# Patient Record
Sex: Female | Born: 1980 | Hispanic: No | Smoking: Former smoker
Health system: Southern US, Community
[De-identification: ages and names within clinical notes are randomized; demographics above are authoritative.]

## PROBLEM LIST (undated history)

## (undated) DIAGNOSIS — F909 Attention-deficit hyperactivity disorder, unspecified type: Secondary | ICD-10-CM

## (undated) DIAGNOSIS — O26619 Liver and biliary tract disorders in pregnancy, unspecified trimester: Secondary | ICD-10-CM

## (undated) DIAGNOSIS — K831 Obstruction of bile duct: Secondary | ICD-10-CM

## (undated) DIAGNOSIS — O26649 Intrahepatic cholestasis of pregnancy, unspecified trimester: Secondary | ICD-10-CM

## (undated) HISTORY — PX: WISDOM TOOTH EXTRACTION: SHX21

## (undated) HISTORY — DX: Obstruction of bile duct: O26.619

## (undated) HISTORY — DX: Obstruction of bile duct: K83.1

## (undated) HISTORY — DX: Intrahepatic cholestasis of pregnancy, unspecified trimester: O26.649

---

## 2018-04-04 ENCOUNTER — Other Ambulatory Visit: Payer: Self-pay | Admitting: Obstetrics and Gynecology

## 2018-04-04 ENCOUNTER — Other Ambulatory Visit (HOSPITAL_COMMUNITY)
Admission: RE | Admit: 2018-04-04 | Discharge: 2018-04-04 | Disposition: A | Discharge status: Home / Self Care | Payer: 59 | Source: Ambulatory Visit | Attending: Obstetrics and Gynecology | Admitting: Obstetrics and Gynecology

## 2018-04-04 DIAGNOSIS — Z01411 Encounter for gynecological examination (general) (routine) with abnormal findings: Secondary | ICD-10-CM | POA: Diagnosis present

## 2018-04-09 LAB — CYTOLOGY - PAP
Diagnosis: NEGATIVE
HPV: NOT DETECTED

## 2021-04-19 ENCOUNTER — Encounter (HOSPITAL_COMMUNITY): Payer: Self-pay | Admitting: Obstetrics and Gynecology

## 2021-04-19 ENCOUNTER — Inpatient Hospital Stay (HOSPITAL_COMMUNITY)
Admission: AD | Admit: 2021-04-19 | Discharge: 2021-04-19 | Disposition: A | Discharge status: Home / Self Care | Payer: BLUE CROSS/BLUE SHIELD | Attending: Obstetrics and Gynecology | Admitting: Obstetrics and Gynecology

## 2021-04-19 ENCOUNTER — Inpatient Hospital Stay (HOSPITAL_COMMUNITY): Payer: BLUE CROSS/BLUE SHIELD

## 2021-04-19 ENCOUNTER — Other Ambulatory Visit: Payer: Self-pay

## 2021-04-19 DIAGNOSIS — Z87891 Personal history of nicotine dependence: Secondary | ICD-10-CM | POA: Insufficient documentation

## 2021-04-19 DIAGNOSIS — Z3A01 Less than 8 weeks gestation of pregnancy: Secondary | ICD-10-CM | POA: Insufficient documentation

## 2021-04-19 DIAGNOSIS — O4691 Antepartum hemorrhage, unspecified, first trimester: Secondary | ICD-10-CM | POA: Diagnosis not present

## 2021-04-19 DIAGNOSIS — O3680X0 Pregnancy with inconclusive fetal viability, not applicable or unspecified: Secondary | ICD-10-CM | POA: Diagnosis present

## 2021-04-19 DIAGNOSIS — W19XXXA Unspecified fall, initial encounter: Secondary | ICD-10-CM | POA: Insufficient documentation

## 2021-04-19 DIAGNOSIS — O3411 Maternal care for benign tumor of corpus uteri, first trimester: Secondary | ICD-10-CM | POA: Diagnosis not present

## 2021-04-19 DIAGNOSIS — O99891 Other specified diseases and conditions complicating pregnancy: Secondary | ICD-10-CM

## 2021-04-19 DIAGNOSIS — O209 Hemorrhage in early pregnancy, unspecified: Secondary | ICD-10-CM | POA: Diagnosis not present

## 2021-04-19 DIAGNOSIS — O09521 Supervision of elderly multigravida, first trimester: Secondary | ICD-10-CM | POA: Insufficient documentation

## 2021-04-19 DIAGNOSIS — W109XXA Fall (on) (from) unspecified stairs and steps, initial encounter: Secondary | ICD-10-CM

## 2021-04-19 DIAGNOSIS — D252 Subserosal leiomyoma of uterus: Secondary | ICD-10-CM | POA: Insufficient documentation

## 2021-04-19 HISTORY — DX: Attention-deficit hyperactivity disorder, unspecified type: F90.9

## 2021-04-19 LAB — CBC
HCT: 36.3 % (ref 36.0–46.0)
Hemoglobin: 12.3 g/dL (ref 12.0–15.0)
MCH: 30.6 pg (ref 26.0–34.0)
MCHC: 33.9 g/dL (ref 30.0–36.0)
MCV: 90.3 fL (ref 80.0–100.0)
Platelets: 248 10*3/uL (ref 150–400)
RBC: 4.02 MIL/uL (ref 3.87–5.11)
RDW: 12.1 % (ref 11.5–15.5)
WBC: 9.1 10*3/uL (ref 4.0–10.5)
nRBC: 0 % (ref 0.0–0.2)

## 2021-04-19 LAB — ABO/RH: ABO/RH(D): A POS

## 2021-04-19 LAB — POCT PREGNANCY, URINE: Preg Test, Ur: POSITIVE — AB

## 2021-04-19 LAB — HCG, QUANTITATIVE, PREGNANCY: hCG, Beta Chain, Quant, S: 2809 m[IU]/mL — ABNORMAL HIGH (ref <5)

## 2021-04-19 LAB — URINALYSIS, ROUTINE W REFLEX MICROSCOPIC
Bilirubin Urine: NEGATIVE
Glucose, UA: NEGATIVE mg/dL
Hgb urine dipstick: NEGATIVE
Ketones, ur: NEGATIVE mg/dL
Leukocytes,Ua: NEGATIVE
Nitrite: NEGATIVE
Protein, ur: NEGATIVE mg/dL
Specific Gravity, Urine: 1.016 (ref 1.005–1.030)
pH: 5 (ref 5.0–8.0)

## 2021-04-19 NOTE — MAU Provider Note (Signed)
History     CSN: [id number]  Arrival date and time: 04/19/21 1911   Event Date/Time   First Provider Initiated Contact with Patient 04/19/21 2025      Chief Complaint  Patient presents with   Vaginal Bleeding   Fall   HPI Alicia Burch is a 40 y.o. [id number] at unknown gestation who presents with vaginal bleeding. She states she fell around 1400 on her stairs. She landed on her bottom and left arm. She states after the fall, she went to the bathroom and saw bright red bleeding when she wiped. She took a shower and saw it again after so she came in. She denies any abdominal pain. She reports pain in her tailbone and feeling like her left arm might bruise.   She was seen at [facility] on Monday and had an early ultrasound and blood work. She reports they only saw a gestational sac on the ultrasound and did not do an HCG. She reports she had an HCG done in mid June at an urgent care that was around 800.  OB History     Gravida  5   Para  3   Term  3   Preterm      AB  1   Living  3      SAB      IAB  1   Ectopic      Multiple      Live Births  3           Past Medical History:  Diagnosis Date   ADHD     Past Surgical History:  Procedure Laterality Date   WISDOM TOOTH EXTRACTION      No family history on file.  Social History   Tobacco Use   Smoking status: Former    Pack years: 0.00    Types: Cigarettes  Vaping Use   Vaping Use: Some days  Substance Use Topics   Alcohol use: Not Currently   Drug use: Never    Allergies:  Allergies  Allergen Reactions   Avelox [Moxifloxacin Hcl] Hives   Strattera [Atomoxetine] Other (See Comments)    Migraines    Medications Prior to Admission  Medication Sig Dispense Refill Last Dose   omega-3 acid ethyl esters (LOVAZA) 1 g capsule Take by mouth 2 (two) times daily.      Prenatal Vit-Fe Fumarate-FA (MULTIVITAMIN-PRENATAL) 27-0.8 MG TABS tablet Take 1 tablet by mouth daily at 12 noon.      Probiotic  Product (PROBIOTIC DAILY PO) Take by mouth.       Review of Systems  Constitutional: Negative.  Negative for fatigue and fever.  HENT: Negative.    Respiratory: Negative.  Negative for shortness of breath.   Cardiovascular: Negative.  Negative for chest pain.  Gastrointestinal: Negative.  Negative for abdominal pain, constipation, diarrhea, nausea and vomiting.  Genitourinary:  Positive for vaginal bleeding. Negative for dysuria and vaginal discharge.  Musculoskeletal:        Arm pain, tailbone pain  Neurological: Negative.  Negative for dizziness and headaches.  Physical Exam   Blood pressure 120/70, pulse 82, temperature 98.2 F (36.8 C), resp. rate 18, height 4' 8.5" (1.435 m), weight 94.3 kg, last menstrual period 03/30/2021.  Physical Exam Vitals and nursing note reviewed.  Constitutional:      General: She is not in acute distress.    Appearance: She is well-developed.  HENT:     Head: Normocephalic.  Eyes:     Pupils:  Pupils are equal, round, and reactive to light.  Cardiovascular:     Rate and Rhythm: Normal rate and regular rhythm.     Heart sounds: Normal heart sounds.  Pulmonary:     Effort: Pulmonary effort is normal. No respiratory distress.     Breath sounds: Normal breath sounds.  Abdominal:     General: Bowel sounds are normal. There is no distension.     Palpations: Abdomen is soft.     Tenderness: There is no abdominal tenderness.  Skin:    General: Skin is warm and dry.  Neurological:     Mental Status: She is alert and oriented to person, place, and time.  Psychiatric:        Mood and Affect: Mood normal.        Behavior: Behavior normal.        Thought Content: Thought content normal.        Judgment: Judgment normal.    MAU Course  Procedures Results for orders placed or performed during the hospital encounter of 04/19/21 (from the past 24 hour(s))  Pregnancy, urine POC     Status: Abnormal   Collection Time: 04/19/21  7:51 PM  Result Value  Ref Range   Preg Test, Ur POSITIVE (A) NEGATIVE  CBC     Status: None   Collection Time: 04/19/21  8:37 PM  Result Value Ref Range   WBC 9.1 4.0 - 10.5 K/uL   RBC 4.02 3.87 - 5.11 MIL/uL   Hemoglobin 12.3 12.0 - 15.0 g/dL   HCT 36.3 36.0 - 46.0 %   MCV 90.3 80.0 - 100.0 fL   MCH 30.6 26.0 - 34.0 pg   MCHC 33.9 30.0 - 36.0 g/dL   RDW 12.1 11.5 - 15.5 %   Platelets 248 150 - 400 K/uL   nRBC 0.0 0.0 - 0.2 %  hCG, quantitative, pregnancy     Status: Abnormal   Collection Time: 04/19/21  8:37 PM  Result Value Ref Range   hCG, Beta Chain, Quant, S 2,809 (H) <5 mIU/mL  ABO/Rh     Status: None   Collection Time: 04/19/21  8:37 PM  Result Value Ref Range   ABO/RH(D) A POS    No rh immune globuloin      NOT A RH IMMUNE GLOBULIN CANDIDATE, PT RH POSITIVE Performed at [facility], [place] [address] [place], [place] [address]   Urinalysis, Routine w reflex microscopic Urine, Clean Catch     Status: None   Collection Time: 04/19/21  9:11 PM  Result Value Ref Range   Color, Urine YELLOW YELLOW   APPearance CLEAR CLEAR   Specific Gravity, Urine 1.016 1.005 - 1.030   pH 5.0 5.0 - 8.0   Glucose, UA NEGATIVE NEGATIVE mg/dL   Hgb urine dipstick NEGATIVE NEGATIVE   Bilirubin Urine NEGATIVE NEGATIVE   Ketones, ur NEGATIVE NEGATIVE mg/dL   Protein, ur NEGATIVE NEGATIVE mg/dL   Nitrite NEGATIVE NEGATIVE   Leukocytes,Ua NEGATIVE NEGATIVE    US OB LESS THAN 14 WEEKS WITH OB TRANSVAGINAL  Result Date: 04/19/2021 CLINICAL DATA:  Vaginal bleeding in 1st trimester pregnancy. EXAM: OBSTETRIC <14 WK [place] AND TRANSVAGINAL OB US TECHNIQUE: Both transabdominal and transvaginal ultrasound examinations were performed for complete evaluation of the gestation as well as the maternal uterus, adnexal regions, and pelvic cul-de-sac. Transvaginal technique was performed to assess early pregnancy. COMPARISON:  None. FINDINGS: Intrauterine gestational sac: Single Yolk sac:  Not Visualized. Embryo:  Not Visualized.  MSD:  10 mm   5 w   5 d Subchorionic hemorrhage:  None visualized. Maternal uterus/adnexae: A small subserosal fibroid is seen in the left fundal region which measures 2.7 cm in maximum diameter. Both ovaries are normal in appearance. No adnexal mass or abnormal free fluid identified. IMPRESSION: Single intrauterine gestational sac measuring 5 weeks 5 days by mean sac diameter. Consider correlation with serial b-hCG levels, and followup ultrasound to assess viability in 10-14 days. 2.7 cm uterine fibroid. Electronically Signed   By: Marlaine Hind M.D.   On: 04/19/2021 21:42     MDM No records available for review from office or urgent care.   UA, UPT CBC, HCG ABO/Rh- A Pos Wet prep and gc/chlamydia- patient declined pelvic exam stating she will have that done in the office US OB Comp Less 14 weeks with Transvaginal  Consulted with Dr. Elly Modena given HCG >1500 but no IUP- recommends repeat HCG in office in 48 hours.   Results reviewed with patient and plan of care. Encouraged patient to call office on Tuesday to make sure appointment is scheduled and ectopic precautions reviewed at length.   Clois Dupes CNM notified of patient complaint and results so that office can be notified.   Assessment and Plan   1. Pregnancy of unknown anatomic location   2. Vaginal bleeding affecting early pregnancy   3. [redacted] weeks gestation of pregnancy    -Discharge home in stable condition -Strict ectopic precautions discussed -Patient advised to follow-up with OB Tuesday afternoon or Wednesday morning for repeat HCG -Patient may return to MAU as needed or if her condition were to change or worsen   Wende Mott CNM 04/19/2021, 8:25 PM

## 2021-04-19 NOTE — MAU Note (Signed)
Pt is [redacted] weeks pregnant. Stated she was going down her stairs and her foot slipped. Landed hard on her bottom and bounced down a few more stairs.  She noticed  vaginal bleeding  a little while after.  Denies any abd pain or cramping but c/o pain in her tailbone area where she landed and bruising on her left forearm and soreness in her right shoulder area.

## 2021-12-08 LAB — OB RESULTS CONSOLE RPR: RPR: NONREACTIVE

## 2021-12-08 LAB — OB RESULTS CONSOLE HEPATITIS B SURFACE ANTIGEN: Hepatitis B Surface Ag: NEGATIVE

## 2021-12-08 LAB — OB RESULTS CONSOLE HIV ANTIBODY (ROUTINE TESTING): HIV: NONREACTIVE

## 2021-12-08 LAB — OB RESULTS CONSOLE RUBELLA ANTIBODY, IGM: Rubella: IMMUNE

## 2021-12-08 LAB — HEPATITIS C ANTIBODY: HCV Ab: NEGATIVE

## 2021-12-16 LAB — OB RESULTS CONSOLE GC/CHLAMYDIA
Chlamydia: NEGATIVE
Neisseria Gonorrhea: NEGATIVE

## 2022-02-25 ENCOUNTER — Inpatient Hospital Stay (HOSPITAL_COMMUNITY)
Admission: AD | Admit: 2022-02-25 | Discharge: 2022-02-26 | Disposition: A | Discharge status: Home / Self Care | Payer: Medicaid Other | Attending: Obstetrics and Gynecology | Admitting: Obstetrics and Gynecology

## 2022-02-25 ENCOUNTER — Other Ambulatory Visit: Payer: Self-pay

## 2022-02-25 ENCOUNTER — Encounter (HOSPITAL_COMMUNITY): Payer: Self-pay | Admitting: Obstetrics and Gynecology

## 2022-02-25 DIAGNOSIS — Z3A21 21 weeks gestation of pregnancy: Secondary | ICD-10-CM | POA: Insufficient documentation

## 2022-02-25 DIAGNOSIS — I10 Essential (primary) hypertension: Secondary | ICD-10-CM

## 2022-02-25 DIAGNOSIS — O26892 Other specified pregnancy related conditions, second trimester: Secondary | ICD-10-CM | POA: Insufficient documentation

## 2022-02-25 DIAGNOSIS — M7989 Other specified soft tissue disorders: Secondary | ICD-10-CM | POA: Insufficient documentation

## 2022-02-26 ENCOUNTER — Encounter (HOSPITAL_COMMUNITY): Payer: Self-pay | Admitting: Obstetrics and Gynecology

## 2022-02-26 DIAGNOSIS — M7989 Other specified soft tissue disorders: Secondary | ICD-10-CM | POA: Diagnosis not present

## 2022-02-26 DIAGNOSIS — Z3A21 21 weeks gestation of pregnancy: Secondary | ICD-10-CM

## 2022-02-26 DIAGNOSIS — I10 Essential (primary) hypertension: Secondary | ICD-10-CM

## 2022-02-26 DIAGNOSIS — O26892 Other specified pregnancy related conditions, second trimester: Secondary | ICD-10-CM | POA: Diagnosis not present

## 2022-02-26 LAB — CBC
HCT: 31.1 % — ABNORMAL LOW (ref 36.0–46.0)
Hemoglobin: 10.9 g/dL — ABNORMAL LOW (ref 12.0–15.0)
MCH: 30.7 pg (ref 26.0–34.0)
MCHC: 35 g/dL (ref 30.0–36.0)
MCV: 87.6 fL (ref 80.0–100.0)
Platelets: 230 10*3/uL (ref 150–400)
RBC: 3.55 MIL/uL — ABNORMAL LOW (ref 3.87–5.11)
RDW: 12.2 % (ref 11.5–15.5)
WBC: 10.1 10*3/uL (ref 4.0–10.5)
nRBC: 0 % (ref 0.0–0.2)

## 2022-02-26 LAB — URINALYSIS, ROUTINE W REFLEX MICROSCOPIC
Bilirubin Urine: NEGATIVE
Glucose, UA: NEGATIVE mg/dL
Hgb urine dipstick: NEGATIVE
Ketones, ur: NEGATIVE mg/dL
Leukocytes,Ua: NEGATIVE
Nitrite: NEGATIVE
Protein, ur: NEGATIVE mg/dL
Specific Gravity, Urine: 1.005 (ref 1.005–1.030)
pH: 5 (ref 5.0–8.0)

## 2022-02-26 LAB — COMPREHENSIVE METABOLIC PANEL
ALT: 25 U/L (ref 0–44)
AST: 25 U/L (ref 15–41)
Albumin: 3 g/dL — ABNORMAL LOW (ref 3.5–5.0)
Alkaline Phosphatase: 56 U/L (ref 38–126)
Anion gap: 6 (ref 5–15)
BUN: 7 mg/dL (ref 6–20)
CO2: 22 mmol/L (ref 22–32)
Calcium: 9 mg/dL (ref 8.9–10.3)
Chloride: 105 mmol/L (ref 98–111)
Creatinine, Ser: 0.59 mg/dL (ref 0.44–1.00)
GFR, Estimated: >60 mL/min (ref >60)
Glucose, Bld: 96 mg/dL (ref 70–99)
Potassium: 3.8 mmol/L (ref 3.5–5.1)
Sodium: 133 mmol/L — ABNORMAL LOW (ref 135–145)
Total Bilirubin: 0.6 mg/dL (ref 0.3–1.2)
Total Protein: 6.2 g/dL — ABNORMAL LOW (ref 6.5–8.1)

## 2022-02-26 LAB — PROTEIN / CREATININE RATIO, URINE
Creatinine, Urine: 56.63 mg/dL
Total Protein, Urine: <6 mg/dL

## 2022-02-26 NOTE — MAU Note (Signed)
Pt says started Monday- back hurt and pain under her ribs- feet and ankles swollen - applied ice ?Then Tuesday- rested with feet propped - had H/A -took Tyl- helped  ?Wed- slight H/A ?Today- feet hurt and swelled, BP- at [facility]- 140/84 at 9pm- called Dr office - told to come here . Feels less movement   ?Takes  ASA everyday  ? [facility] - Dr Delora Fuel  ?

## 2022-02-26 NOTE — MAU Provider Note (Signed)
Chief Complaint:  Foot Swelling ? ? Event Date/Time  ? First Provider Initiated Contact with Patient 02/26/22 0214   ? ? HPI: Alicia Burch is a 41 y.o. [id number] at 1w2dho presents to maternity admissions reporting intermittent back and upper abdominal pain (none now), headache (resolved) and feet swelling this week.  [facility] and took her BP which was elevated. Has not confirmed BP anywhere else. No history or hypertension..Marland Kitchen?She reports good fetal movement, denies LOF, vaginal bleeding, vaginal itching/burning, urinary symptoms, h/a, dizziness, n/v, diarrhea, constipation or fever/chills. ? ?Hypertension ?This is a new problem. The current episode started today. The problem has been resolved since onset. Associated symptoms include headaches (one slight headache on Wednesday). Pertinent negatives include no anxiety, blurred vision, chest pain, malaise/fatigue or palpitations. There are no associated agents to hypertension. There are no known risk factors for coronary artery disease. Past treatments include nothing.  ? ?RN Note ?Pt says started Monday- back hurt and pain under her ribs- feet and ankles swollen - applied ice ?Then Tuesday- rested with feet propped - had H/A -took Tyl- helped  ?Wed- slight H/A ?Today- feet hurt and swelled, [facility]- 140/84 at 9pm- called Dr office - told to come here . Feels less movement   ?Takes  ASA everyday  [facility] ? ?Past Medical History: ?Past Medical History:  ?Diagnosis Date  ? ADHD   ? ? ?Past obstetric history: ?OB History  ?Gravida Para Term Preterm AB Living  ?'6 3 3   2 3  '$ ?SAB IAB Ectopic Multiple Live Births  ?'1 1     3  '$ ?  ?# Outcome Date GA Lbr Len/2nd Weight Sex Delivery Anes PTL Lv  ?6 Current           ?5 SAB           ?4 IAB           ?3 Term      Vag-Spont   LIV  ?2 Term      Vag-Spont   LIV  ?1 Term      Vag-Spont   LIV  ? ? ?Past Surgical History: ?Past Surgical History:  ?Procedure Laterality Date  ? WISDOM TOOTH EXTRACTION     ? ? ?Family History: ?History reviewed. No pertinent family history. ? ?Social History: ?Social History  ? ?Tobacco Use  ? Smoking status: Former  ?  Types: Cigarettes  ?Vaping Use  ? Vaping Use: Some days  ?Substance Use Topics  ? Alcohol use: Not Currently  ? Drug use: Never  ? ? ?Allergies:  ?Allergies  ?Allergen Reactions  ? Avelox [Moxifloxacin Hcl] Hives  ? Strattera [Atomoxetine] Other (See Comments)  ?  Migraines  ? ? ?Meds:  ?Medications Prior to Admission  ?Medication Sig Dispense Refill Last Dose  ? omega-3 acid ethyl esters (LOVAZA) 1 g capsule Take by mouth 2 (two) times daily.     ? Prenatal Vit-Fe Fumarate-FA (MULTIVITAMIN-PRENATAL) 27-0.8 MG TABS tablet Take 1 tablet by mouth daily at 12 noon.     ? Probiotic Product (PROBIOTIC DAILY PO) Take by mouth.     ? ? ?I have reviewed patient's Past Medical Hx, Surgical Hx, Family Hx, Social Hx, medications and allergies.  ? ?ROS:  ?Review of Systems  ?Constitutional:  Negative for malaise/fatigue.  ?Eyes:  Negative for blurred vision.  ?Cardiovascular:  Negative for chest pain and palpitations.  ?Neurological:  Positive for headaches (one slight headache on Wednesday).  ?  Other systems negative ? ?Physical Exam  ?Patient Vitals for the past 24 hrs: ? BP Temp Temp src Pulse Resp Height Weight  ?02/26/22 0030 130/64 98.2 ?F (36.8 ?C) Oral 91 20 '5\' 6"'$  (1.676 m) 114.2 kg  ? ?Vitals:  ? 02/26/22 0030 02/26/22 0218 02/26/22 0245  ?BP: 130/64 131/68 125/62  ?Pulse: 91 85 85  ?Resp: 20    ?Temp: 98.2 ?F (36.8 ?C)    ?TempSrc: Oral    ?Weight: 114.2 kg    ?Height: '5\' 6"'$  (1.676 m)    ? ? ?Constitutional: Well-developed, well-nourished female in no acute distress.  ?Cardiovascular: normal rate and rhythm ?Respiratory: normal effort ?GI: Abd soft, non-tender, gravid appropriate for gestational age.   No rebound or guarding. ?MS: Extremities nontender, Trace to 1+ pedal edema, normal ROM ?Neurologic: Alert and oriented x 4.   DTRs 2+ ?GU: Neg CVAT. ? ?FHT:  156 ?   ?Labs: ?Results for orders placed or performed during the hospital encounter of 02/25/22 (from the past 24 hour(s))  ?Urinalysis, Routine w reflex microscopic Urine, Clean Catch     Status: None  ? Collection Time: 02/26/22 12:41 AM  ?Result Value Ref Range  ? Color, Urine YELLOW YELLOW  ? APPearance CLEAR CLEAR  ? Specific Gravity, Urine 1.005 1.005 - 1.030  ? pH 5.0 5.0 - 8.0  ? Glucose, UA NEGATIVE NEGATIVE mg/dL  ? Hgb urine dipstick NEGATIVE NEGATIVE  ? Bilirubin Urine NEGATIVE NEGATIVE  ? Ketones, ur NEGATIVE NEGATIVE mg/dL  ? Protein, ur NEGATIVE NEGATIVE mg/dL  ? Nitrite NEGATIVE NEGATIVE  ? Leukocytes,Ua NEGATIVE NEGATIVE  ?Protein / creatinine ratio, urine     Status: None  ? Collection Time: 02/26/22 12:41 AM  ?Result Value Ref Range  ? Creatinine, Urine 56.63 mg/dL  ? Total Protein, Urine <6 mg/dL  ? Protein Creatinine Ratio NOT CALCULATED 0.00 - 0.15 mg/mg[Cre]  ?CBC     Status: Abnormal  ? Collection Time: 02/26/22  1:02 AM  ?Result Value Ref Range  ? WBC 10.1 4.0 - 10.5 K/uL  ? RBC 3.55 (L) 3.87 - 5.11 MIL/uL  ? Hemoglobin 10.9 (L) 12.0 - 15.0 g/dL  ? HCT 31.1 (L) 36.0 - 46.0 %  ? MCV 87.6 80.0 - 100.0 fL  ? MCH 30.7 26.0 - 34.0 pg  ? MCHC 35.0 30.0 - 36.0 g/dL  ? RDW 12.2 11.5 - 15.5 %  ? Platelets 230 150 - 400 K/uL  ? nRBC 0.0 0.0 - 0.2 %  ?Comprehensive metabolic panel     Status: Abnormal  ? Collection Time: 02/26/22  1:02 AM  ?Result Value Ref Range  ? Sodium 133 (L) 135 - 145 mmol/L  ? Potassium 3.8 3.5 - 5.1 mmol/L  ? Chloride 105 98 - 111 mmol/L  ? CO2 22 22 - 32 mmol/L  ? Glucose, Bld 96 70 - 99 mg/dL  ? BUN 7 6 - 20 mg/dL  ? Creatinine, Ser 0.59 0.44 - 1.00 mg/dL  ? Calcium 9.0 8.9 - 10.3 mg/dL  ? Total Protein 6.2 (L) 6.5 - 8.1 g/dL  ? Albumin 3.0 (L) 3.5 - 5.0 g/dL  ? AST 25 15 - 41 U/L  ? ALT 25 0 - 44 U/L  ? Alkaline Phosphatase 56 38 - 126 U/L  ? Total Bilirubin 0.6 0.3 - 1.2 mg/dL  ? GFR, Estimated >60 >60 mL/min  ? Anion gap 6 5 - 15  ? ?--/--/A POS (07/03 2037) ? ?Imaging:  ?No  results found. ? ?MAU Course/MDM: ?  I have reviewed the triage vital signs and the nursing notes. ?  ?Pertinent labs & imaging results that were available during my care of the patient were reviewed by me and considered in my medical decision making (see chart for details).      I have reviewed her medical records including past results, notes and treatments.  ? ?I have ordered labs and reviewed results. These are normal   ? ?Treatments in MAU included Labs and serial BPs ? ?DIscussed Preeclampsia is not usually seen before 24 weeks.  The elevated BP at the drug store may have been inaccurate. All BPs measured here are normal with low diastolics.  Labs are normal   No evidence for preeclampsia .   ? ?Assessment: ?S[place]gle IUP at [redacted]w[redacted]d?Isolated finding of single hypertensive blood pressure ?Normal blood pressures here ?Normal labs ?Intermittent edema, possibly dependent. ? ?Plan: ?Discharge home ?Preeclampsia precautions ?Follow up in Office for prenatal visits and recheck of BP ?Encouraged to return if she develops worsening of symptoms, increase in pain, fever, or other concerning symptoms. ? ?Pt stable at time of discharge. ? ?MHansel FeinsteinCNM, MSN ?Certified Nurse-Midwife ?02/26/2022 ?2:15 AM ?

## 2022-04-05 ENCOUNTER — Ambulatory Visit: Payer: Medicaid Other | Attending: Sports Medicine

## 2022-04-05 DIAGNOSIS — M79601 Pain in right arm: Secondary | ICD-10-CM | POA: Diagnosis present

## 2022-04-05 DIAGNOSIS — M6281 Muscle weakness (generalized): Secondary | ICD-10-CM | POA: Insufficient documentation

## 2022-04-05 NOTE — Therapy (Signed)
OUTPATIENT PHYSICAL THERAPY SHOULDER EVALUATION   Patient Name: Alicia Burch MRN: [id number] DOB:September 24, 1981, 40 y.o., female Today's Date: 04/06/2022   PT End of Session - 04/06/22 1142     Visit Number 1    Number of Visits 7    Date for PT Re-Evaluation 05/18/22    Authorization Type [employer]    PT Start Time 1453   arrived late   PT Stop Time 1530    PT Time Calculation (min) 37 min    Activity Tolerance Patient tolerated treatment well    Behavior During Therapy WFL for tasks assessed/performed             Past Medical History:  Diagnosis Date   ADHD    Past Surgical History:  Procedure Laterality Date   WISDOM TOOTH EXTRACTION     There are no problems to display for this patient.   PCP: No PCP  REFERRING PROVIDER:  Inez Catalina, MD  REFERRING DIAG: pain of right scapula  THERAPY DIAG:  Pain in right arm  Muscle weakness (generalized)  Rationale for Evaluation and Treatment Rehabilitation  ONSET DATE: April 2023  SUBJECTIVE:                                                                                                                                                                                      SUBJECTIVE STATEMENT: Pt presents to PT with reports of R UE pain and numbness over the last few weeks. Notices some continued numbness in her 2-4 digits in her R hand. She is RHD and notes that pain has somewhat subsided in the last few weeks. Has had pregnancy related carpal tunnel symptoms in R forearm and hand as well. Numbness and tingling is isolated mainly to R hand, with other pain more proximal and upper trap tightness also resolving recently.  PERTINENT HISTORY: Currently [redacted] weeks pregnant   PAIN:  Are you having pain?  No: NPRS scale: 0/10 (7/10) Pain location: R shoulder Pain description: sharp, numb Aggravating factors: reaching, lifting, laying on R shoulder Relieving factors: positioning, heat  PRECAUTIONS:  None  WEIGHT BEARING RESTRICTIONS No  FALLS:  Has patient fallen in last 6 months? No  LIVING ENVIRONMENT: Lives with: lives with their family Lives in: House/apartment Stairs: yes - no barriers Has following equipment at home: None  OCCUPATION: Not currently working  PLOF: Independent and Independent with basic ADLs  PATIENT GOALS: decrease  OBJECTIVE:   DIAGNOSTIC FINDINGS:  N/A  PATIENT SURVEYS:  Quick Dash 48% disability  COGNITION:  Overall cognitive status: Within functional limits for tasks assessed     SENSATION: Light touch: decreased light tough R  palm and first 3 digits   POSTURE: Large body habitus; increased lordosis (pt is currently pregnant)  UE ROM:  AROM Right 04/05/2022 Left 04/05/2022  Shoulder flexion WNL WNL  Shoulder extension WNL WNL  Shoulder abduction WNL WNL  Shoulder internal rotation WNL WNL  Shoulder external rotation WNL WNL  Elbow flexion WNL WNL  Elbow extension WNL WNL  Wrist flexion WNL WNL  Wrist extension WNL WNL  (Blank rows = not tested)   UE Myotomes:  Myotome Right Left  C5 5/5 5/5  C6 5/5 5/5  C7 5/5 5/5  C8 5/5 5/5  T1 5/5 5/5   UE MMT:   MMT Right 04/05/2022   Left  04/05/2022   Shoulder flexion 4/5 4/5  Shoulder abduction 4/5 4/5  Shoulder ER 4/5 4/5  Shoulder IR 4/5 4/5  Middle trapezius    Lower trapezius    Shoulder extension    Grip strength    (Blank rows = not tested)   SPECIAL TESTS:  ULTT: Median (+) R  Tinels: (+) R  Phalens: (+)  PALPATION:  Normal scapulo-humeral rhythm bilaterally   TODAY'S TREATMENT:  [facility] Adult PT Treatment:                                                DATE: 04/05/2022 Therapeutic Exercise: Median nerve glide R x 10 Seated upper trap stretch x 30" R Row BTB x 10   PATIENT EDUCATION: Education details: eval findings, Quick DASH, HEP, POC Person educated: Patient Education method: Explanation, Demonstration, and Handouts Education comprehension:  verbalized understanding and returned demonstration   HOME EXERCISE PROGRAM: Access Code: [email or web address] URL: [email or web address] Date: 04/05/2022 Prepared by: Octavio Manns  Exercises - Median Nerve Tensioner  - 1-2 x daily - 7 x weekly - 3 sets - 10 reps - 3 sec hold - Seated Upper Trapezius Stretch  - 1-2 x daily - 7 x weekly - 2-3 reps - 30 sec hold - Standing Shoulder Row with Anchored Resistance  - 1 x daily - 7 x weekly - 3 sets - 10 reps  ASSESSMENT:  CLINICAL IMPRESSION: Patient is a 41 y.o. F who was seen today for physical therapy evaluation and treatment for R UE pain/paresthesias. Physical findings are consistent with MD impression, as pt has positive neural tension testing for R UE and some UE weakness. Her Quick DASH score indicates moderate disability in the performance of home ADLs and community activity, indicating she is operating below PLOF. She would benefit from skilled PT working to decrease pain and paresthesias and improve comfort.    OBJECTIVE IMPAIRMENTS impaired sensation and pain.   ACTIVITY LIMITATIONS carrying, lifting, and reach over head  PARTICIPATION LIMITATIONS: meal prep, cleaning, community activity, and yard work  PERSONAL FACTORS 1 comorbidity: currently pregnant  are also affecting patient's functional outcome.   REHAB POTENTIAL: Excellent  CLINICAL DECISION MAKING: Stable/uncomplicated  EVALUATION COMPLEXITY: Low   GOALS: Goals reviewed with patient? No  SHORT TERM GOALS: Target date: 04/27/2022   Pt will be compliant and knowledgeable with initial HEP for improved comfort and carryover Baseline: initial HEP given  Goal status: INITIAL  2.  Pt will self report R UE pain no greater than 2/10 for improved comfort and functional ability Baseline: 7/10 at worst Goal status: INITIAL  LONG TERM GOALS: Target date: 05/18/2022    Pt will  self report R UE pain no greater than 0/10 for improved comfort and functional  ability Baseline: 7/10 at worst Goal status: INITIAL  2.  Pt will decrease Quick DASH disability score to no greater than 30% as proxy for functional improvement Baseline: 48% disability Goal status: INITIAL  3.  Pt will be compliant and knowledgeable with advanced HEP for continued improvement post discharge from PT Baseline:  Goal status: INITIAL   PLAN: PT FREQUENCY: 1x/week  PT DURATION: 6 weeks  PLANNED INTERVENTIONS: Therapeutic exercises, Therapeutic activity, Neuromuscular re-education, Balance training, Gait training, Patient/Family education, Joint mobilization, Cryotherapy, Moist heat, Manual therapy, and Re-evaluation  PLAN FOR NEXT SESSION: assess HEP response, progress periscapular strength and neural tension stretches  Check all possible CPT codes: 97164 - PT Re-evaluation, 97110- Therapeutic Exercise, [phone]- Neuro Re-education, [phone] - Gait Training, [phone] - Manual Therapy, 97530 - Therapeutic Activities, and 97535 - Self Care     If treatment provided at initial evaluation, no treatment charged due to lack of authorization.       Ward Chatters, PT 04/06/2022, 11:43 AM

## 2022-04-22 ENCOUNTER — Ambulatory Visit: Payer: Medicaid Other

## 2022-04-29 IMAGING — US US OB < 14 WEEKS - US OB TV
1 series · 15 of 28 positions shown · non-contrast
Comparison: None.

CLINICAL DATA: Vaginal bleeding in 1st trimester pregnancy.

EXAM:
OBSTETRIC <14 WK US AND TRANSVAGINAL OB US
TECHNIQUE: Both transabdominal and transvaginal ultrasound examinations were
performed for complete evaluation of the gestation as well as the
maternal uterus, adnexal regions, and pelvic cul-de-sac.
Transvaginal technique was performed to assess early pregnancy.

[Series 1: us ob < 14 weeks - us ob tv · 15 of 38 slices shown]
[im 1/38]
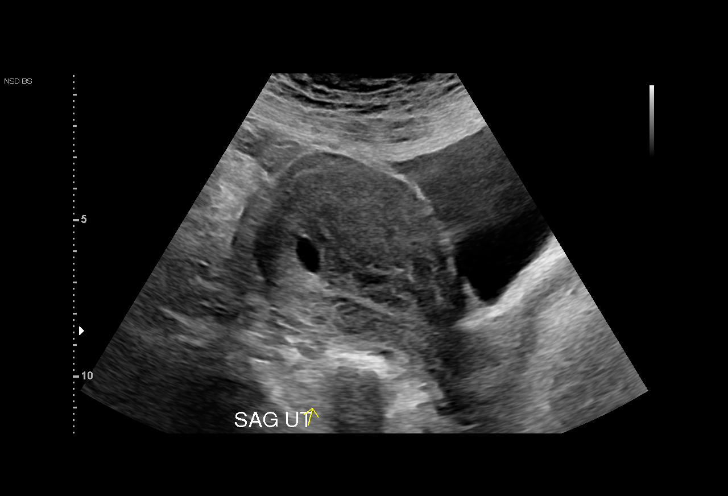
[im 3/38]
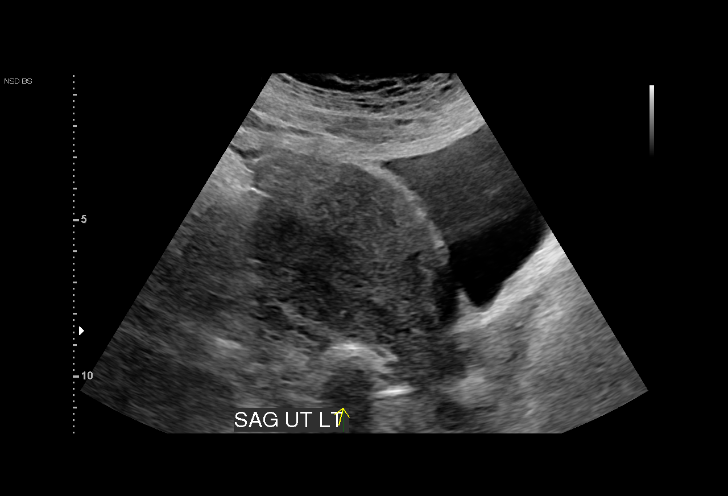
[im 6/38]
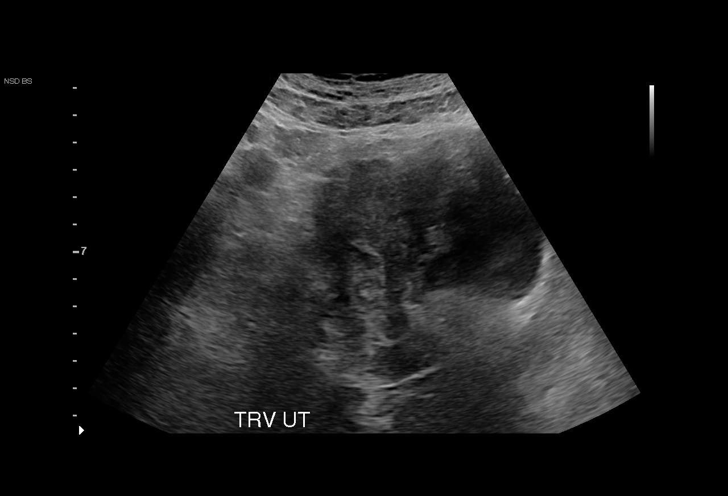
[im 9/38]
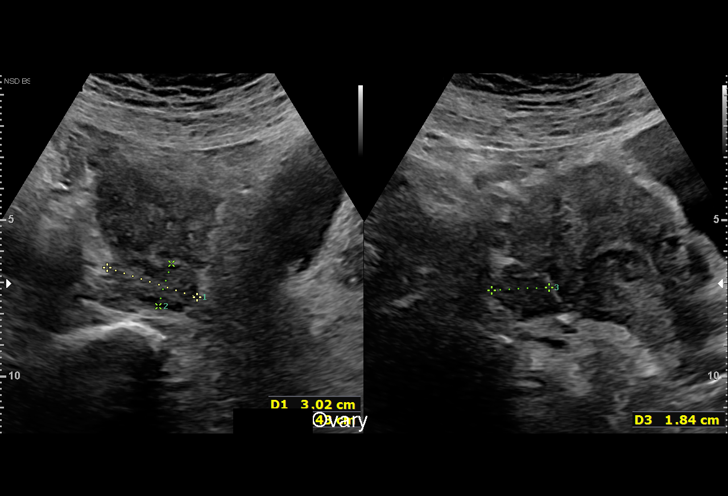
[im 11/38]
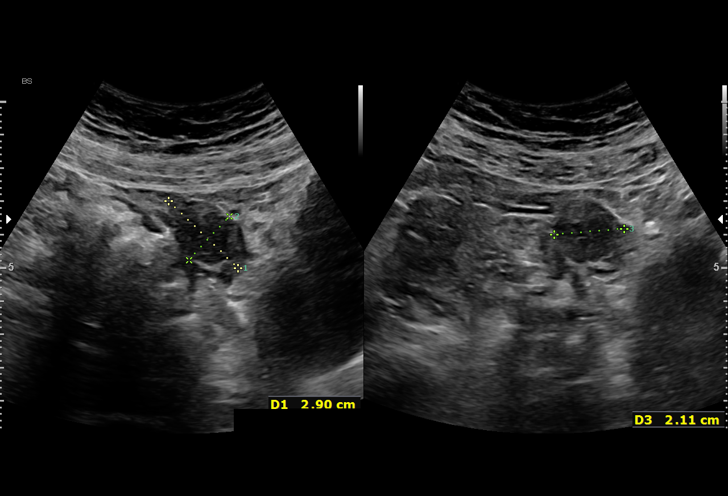
[im 14/38]
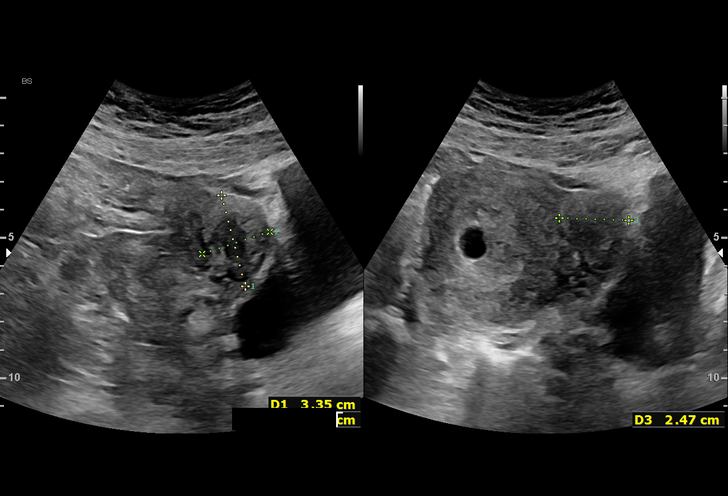
[im 17/38]
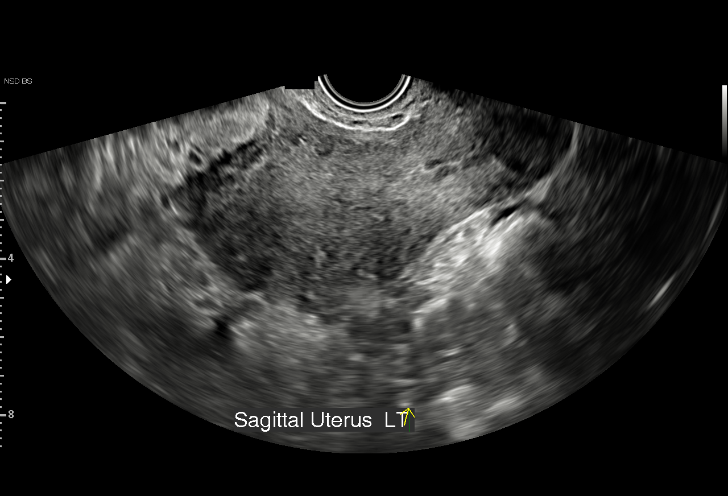
[im 20/38]
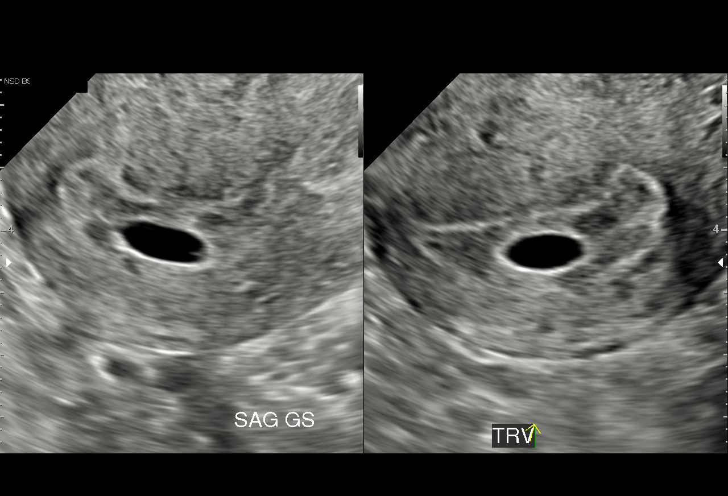
[im 21/38]
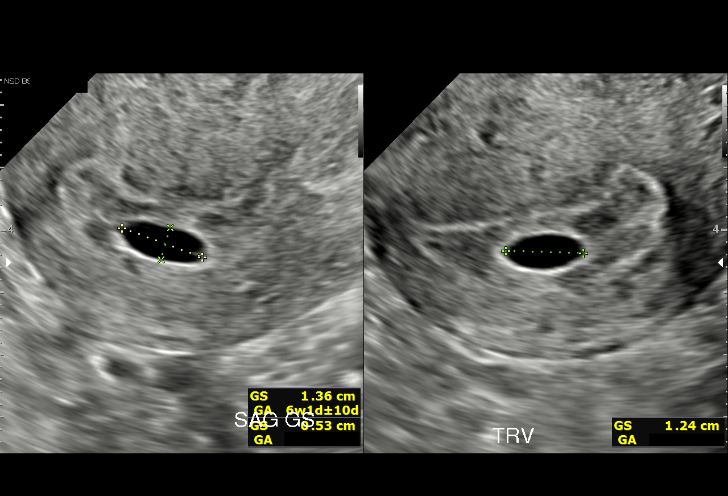
[im 24/38]
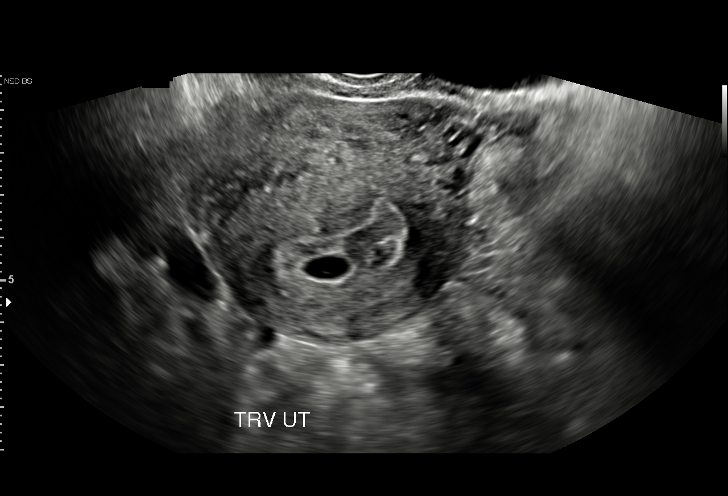
[im 27/38]
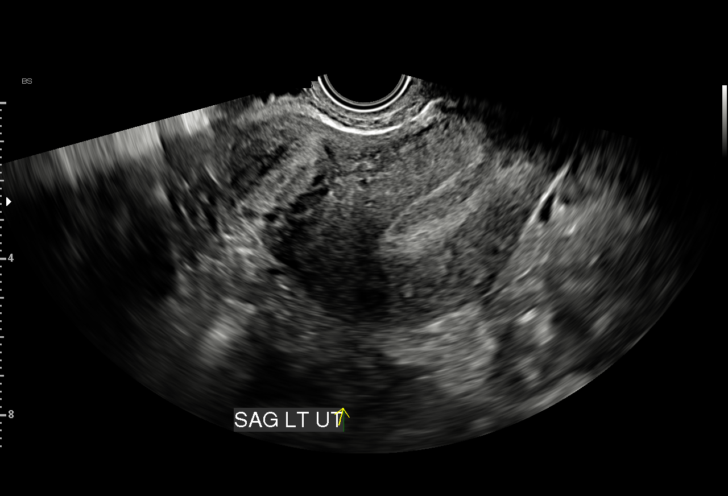
[im 29/38]
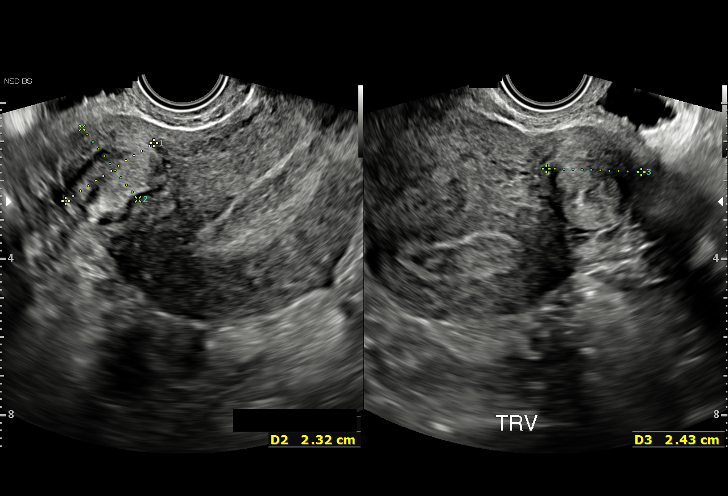
[im 32/38]
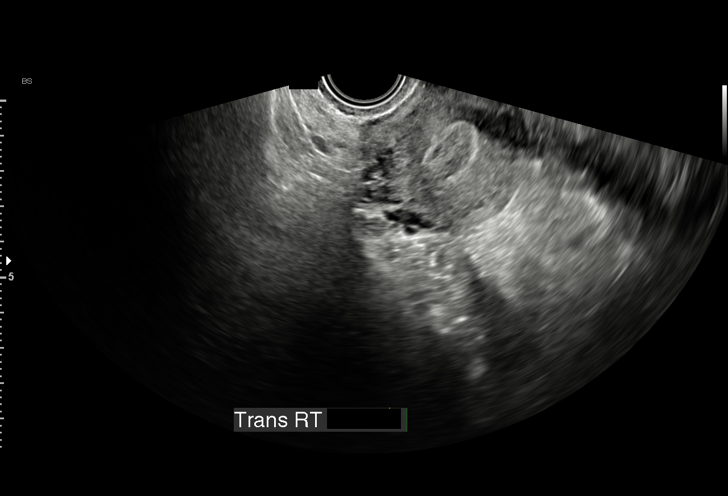
[im 35/38]
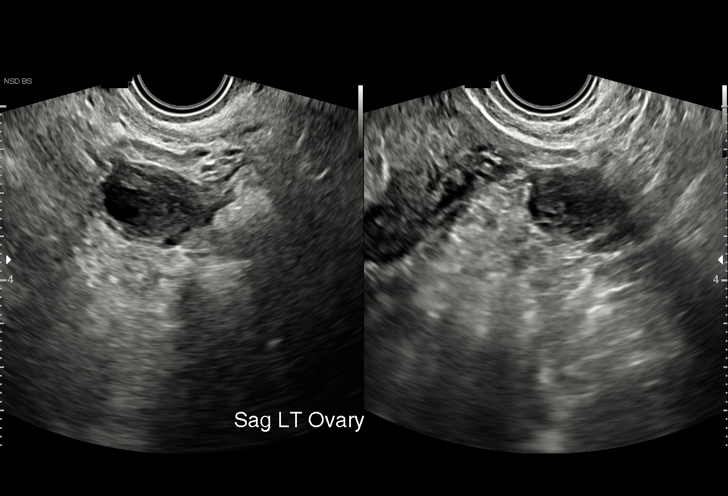
[im 38/38]
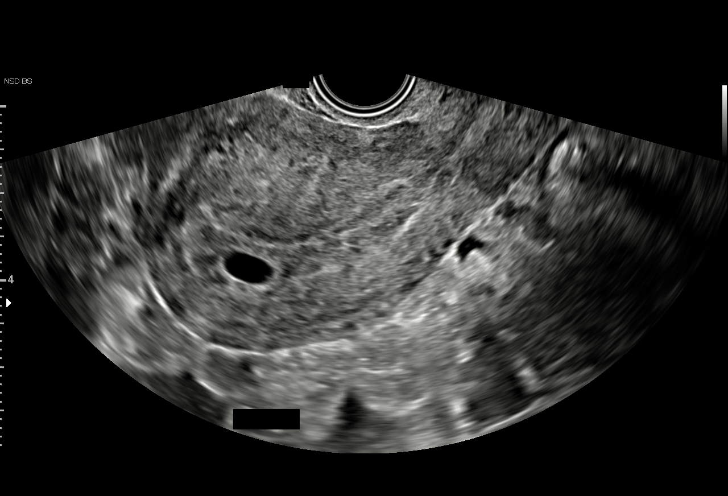

[15 of 28 positions shown; findings below may reference images not displayed]

FINDINGS: Intrauterine gestational sac: Single

Yolk sac:  Not Visualized.

Embryo:  Not Visualized.

MSD: 10 mm   5 w   5 d

Subchorionic hemorrhage:  None visualized.

Maternal uterus/adnexae: A small subserosal fibroid is seen in the
left fundal region which measures 2.7 cm in maximum diameter. Both
ovaries are normal in appearance. No adnexal mass or abnormal free
fluid identified.
IMPRESSION: Single intrauterine gestational sac measuring 5 weeks 5 days by mean
sac diameter. Consider correlation with serial b-hCG levels, and
followup ultrasound to assess viability in 10-14 days.

2.7 cm uterine fibroid.

## 2022-06-08 LAB — OB RESULTS CONSOLE GBS: GBS: POSITIVE

## 2022-06-14 ENCOUNTER — Encounter (HOSPITAL_COMMUNITY): Payer: Self-pay | Admitting: *Deleted

## 2022-06-14 ENCOUNTER — Telehealth (HOSPITAL_COMMUNITY): Payer: Self-pay | Admitting: *Deleted

## 2022-06-14 NOTE — Progress Notes (Signed)
HPI: 41 y.o. [id number] @ 40w6destimated gestational age (as dated by LMP c/w 10 week ultrasound) presents for induction of labor for cholestasis of pregnancy.  Bile acids were 13.7 on 06/08/2022. Discussed patient with MFM (Dr. SDonalee Citrin who recommended delivery at 37 weeks. Unfortunately, L&D did not have any openings for induction of labor on 8/30 or 8/31 (daytime or midnight slots). Due to this and given cholestasis with AMA status (41yo), decision was made to proceed with IOL on 8/29.  Leakage of fluid:  No Vaginal bleeding:  No Contractions:  No Fetal movement:  Yes  Prenatal care has [facility]  ROS:  Denies fevers, chills, chest pain, visual changes, SOB, RUQ/epigastric pain, N/V, dysuria, hematuria, or sudden onset/worsening bilateral LE or facial edema.  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)  Prenatal Transfer Tool  Maternal Diabetes: No Genetic Screening: Normal - Low risk female panorama Maternal Ultrasounds/Referrals: Normal Fetal Ultrasounds or other Referrals:  Other: Serial growth and fetal testing Maternal Substance Abuse:  Yes:  Type: Smoker Significant Maternal Medications:  None Significant Maternal Lab Results: Group B Strep positive   Prenatal Labs Blood type:  A Positive Antibody screen:  Negative CBC:  H/H 10.2/30.6 Rubella: Immune RPR:  Non-reactive Hep B:  Negative Hep C:  Negative HIV:  Negative GC/CT:  Negative Glucola:  84.6 (wnl)  Immunizations: Tdap: Given prenatally Flu: Declines  OBHx:  OB History     Gravida  6   Para  3   Term  3   Preterm      AB  2    Living  3      SAB  1   IAB  1   Ectopic      Multiple      Live Births  3          PMHx:  See above Meds:  PNV, ASA '81mg'$  daily Allergy:   Allergies  Allergen Reactions   Avelox [Moxifloxacin Hcl] Hives   Strattera [Atomoxetine] Other (See Comments)    Migraines   SurgHx: None SocHx:   Denies Tobacco, ETOH, illicit drugs  O: LMP 076/73/4193 Gen. AAOx3, NAD CV.  RRR  Resp. CTAB, no wheezes/rales/rhonchi Abd. Gravid, soft, non-tender throughout, no rebound/guarding Extr.  Trace bilateral LE edema, no calf tenderness bilaterally SVE (06/14/22): 0.5/thick/high   Last [place](06/14/22): BPP 179/02 fetus cephalic  Last growth [place](05/24/22): 3110w5dFW 2840g 6 lbs 4 oz (9640% AAFV, Cephalic, anterior placenta  NST (06/14/22): Reactive   Labs: see orders  A/P:  [age].o. [id number] 3690w6do presents for induction of labor for cholestasis.  - Admit to L&D - Admit labs (CBC, T&S, COVID screen) - CEFM/Toco - FWB:  BPP 10/10 - Diet:  Clear liquids - IVF:  LR at 125cc/hour - VTE Prophylaxis:  SCDs - GBS Status:  Positive - PCN ordered - Presentation:  Cephalic on [place] control:  Per patient request - Induction method:  Cytotec vaginally for cervical ripening followed by Pitocin - Anticipate SVD  MelDrema DallasO [phone])

## 2022-06-14 NOTE — Telephone Encounter (Signed)
Preadmission screen  

## 2022-06-15 ENCOUNTER — Inpatient Hospital Stay (HOSPITAL_COMMUNITY)
Admission: AD | Admit: 2022-06-15 | Discharge: 2022-06-19 | DRG: 786 | Disposition: A | Discharge status: Home / Self Care | Payer: Medicaid Other | Attending: Obstetrics and Gynecology | Admitting: Obstetrics and Gynecology

## 2022-06-15 ENCOUNTER — Encounter (HOSPITAL_COMMUNITY): Payer: Self-pay | Admitting: Obstetrics and Gynecology

## 2022-06-15 ENCOUNTER — Inpatient Hospital Stay (HOSPITAL_COMMUNITY): Payer: Medicaid Other

## 2022-06-15 ENCOUNTER — Other Ambulatory Visit: Payer: Self-pay

## 2022-06-15 ENCOUNTER — Inpatient Hospital Stay (HOSPITAL_COMMUNITY): Payer: Medicaid Other | Admitting: Anesthesiology

## 2022-06-15 DIAGNOSIS — O99824 Streptococcus B carrier state complicating childbirth: Secondary | ICD-10-CM | POA: Diagnosis present

## 2022-06-15 DIAGNOSIS — D62 Acute posthemorrhagic anemia: Secondary | ICD-10-CM | POA: Diagnosis not present

## 2022-06-15 DIAGNOSIS — O99214 Obesity complicating childbirth: Secondary | ICD-10-CM | POA: Diagnosis present

## 2022-06-15 DIAGNOSIS — K831 Obstruction of bile duct: Secondary | ICD-10-CM | POA: Diagnosis present

## 2022-06-15 DIAGNOSIS — O9081 Anemia of the puerperium: Secondary | ICD-10-CM | POA: Diagnosis not present

## 2022-06-15 DIAGNOSIS — O09523 Supervision of elderly multigravida, third trimester: Secondary | ICD-10-CM | POA: Diagnosis not present

## 2022-06-15 DIAGNOSIS — D509 Iron deficiency anemia, unspecified: Secondary | ICD-10-CM | POA: Diagnosis present

## 2022-06-15 DIAGNOSIS — Z3A36 36 weeks gestation of pregnancy: Secondary | ICD-10-CM | POA: Diagnosis not present

## 2022-06-15 DIAGNOSIS — O9902 Anemia complicating childbirth: Secondary | ICD-10-CM | POA: Diagnosis not present

## 2022-06-15 DIAGNOSIS — O99334 Smoking (tobacco) complicating childbirth: Secondary | ICD-10-CM | POA: Diagnosis present

## 2022-06-15 DIAGNOSIS — F17290 Nicotine dependence, other tobacco product, uncomplicated: Secondary | ICD-10-CM | POA: Diagnosis present

## 2022-06-15 DIAGNOSIS — O9852 Other viral diseases complicating childbirth: Secondary | ICD-10-CM | POA: Diagnosis present

## 2022-06-15 DIAGNOSIS — D649 Anemia, unspecified: Secondary | ICD-10-CM | POA: Diagnosis not present

## 2022-06-15 DIAGNOSIS — O2662 Liver and biliary tract disorders in childbirth: Secondary | ICD-10-CM | POA: Diagnosis present

## 2022-06-15 DIAGNOSIS — B001 Herpesviral vesicular dermatitis: Secondary | ICD-10-CM | POA: Diagnosis present

## 2022-06-15 DIAGNOSIS — Z3A37 37 weeks gestation of pregnancy: Secondary | ICD-10-CM | POA: Diagnosis not present

## 2022-06-15 DIAGNOSIS — O26619 Liver and biliary tract disorders in pregnancy, unspecified trimester: Principal | ICD-10-CM | POA: Diagnosis present

## 2022-06-15 DIAGNOSIS — O99820 Streptococcus B carrier state complicating pregnancy: Secondary | ICD-10-CM | POA: Diagnosis not present

## 2022-06-15 LAB — CBC
HCT: 28.6 % — ABNORMAL LOW (ref 36.0–46.0)
Hemoglobin: 9.8 g/dL — ABNORMAL LOW (ref 12.0–15.0)
MCH: 28.4 pg (ref 26.0–34.0)
MCHC: 34.3 g/dL (ref 30.0–36.0)
MCV: 82.9 fL (ref 80.0–100.0)
Platelets: 228 10*3/uL (ref 150–400)
RBC: 3.45 MIL/uL — ABNORMAL LOW (ref 3.87–5.11)
RDW: 13.5 % (ref 11.5–15.5)
WBC: 11.3 10*3/uL — ABNORMAL HIGH (ref 4.0–10.5)
nRBC: 0 % (ref 0.0–0.2)

## 2022-06-15 LAB — RPR: RPR Ser Ql: NONREACTIVE

## 2022-06-15 LAB — TYPE AND SCREEN
ABO/RH(D): A POS
Antibody Screen: NEGATIVE

## 2022-06-15 MED ORDER — HYDROXYZINE HCL 50 MG PO TABS: 50.0000 mg | ORAL_TABLET | Freq: Four times a day (QID) | ORAL | Status: DC | PRN

## 2022-06-15 MED ORDER — OXYCODONE-ACETAMINOPHEN 5-325 MG PO TABS: 2.0000 | ORAL_TABLET | ORAL | Status: DC | PRN

## 2022-06-15 MED ORDER — LACTATED RINGERS IV SOLN: 500.0000 mL | Freq: Once | INTRAVENOUS | Status: DC

## 2022-06-15 MED ORDER — EPHEDRINE 5 MG/ML INJ: 10.0000 mg | INTRAVENOUS | Status: DC | PRN

## 2022-06-15 MED ORDER — LIDOCAINE HCL (PF) 1 % IJ SOLN
INTRAMUSCULAR | Status: DC | PRN
  Administered 2022-06-15: 5 mL via EPIDURAL

## 2022-06-15 MED ORDER — PHENYLEPHRINE 80 MCG/ML (10ML) SYRINGE FOR IV PUSH (FOR BLOOD PRESSURE SUPPORT): 80.0000 ug | PREFILLED_SYRINGE | INTRAVENOUS | Status: DC | PRN

## 2022-06-15 MED ORDER — LACTATED RINGERS IV SOLN: INTRAVENOUS | Status: DC

## 2022-06-15 MED ORDER — LIDOCAINE-EPINEPHRINE (PF) 1.5 %-1:200000 IJ SOLN
INTRAMUSCULAR | Status: DC | PRN
  Administered 2022-06-15: 5 mL via EPIDURAL

## 2022-06-15 MED ORDER — LIDOCAINE HCL (PF) 1 % IJ SOLN: 30.0000 mL | INTRAMUSCULAR | Status: DC | PRN

## 2022-06-15 MED ORDER — OXYTOCIN BOLUS FROM INFUSION: 333.0000 mL | Freq: Once | INTRAVENOUS | Status: DC

## 2022-06-15 MED ORDER — OXYCODONE-ACETAMINOPHEN 5-325 MG PO TABS: 1.0000 | ORAL_TABLET | ORAL | Status: DC | PRN

## 2022-06-15 MED ORDER — FENTANYL CITRATE (PF) 100 MCG/2ML IJ SOLN: 50.0000 ug | INTRAMUSCULAR | Status: DC | PRN

## 2022-06-15 MED ORDER — SODIUM CHLORIDE 0.9 % IV SOLN
5.0000 10*6.[IU] | Freq: Once | INTRAVENOUS | Status: AC
  Administered 2022-06-15: 5 10*6.[IU] via INTRAVENOUS
  Filled 2022-06-15: qty 5

## 2022-06-15 MED ORDER — ACETAMINOPHEN 325 MG PO TABS: 650.0000 mg | ORAL_TABLET | ORAL | Status: DC | PRN

## 2022-06-15 MED ORDER — SOD CITRATE-CITRIC ACID 500-334 MG/5ML PO SOLN
30.0000 mL | ORAL | Status: DC | PRN
  Administered 2022-06-15 – 2022-06-16 (×3): 30 mL via ORAL
  Filled 2022-06-15 (×3): qty 30

## 2022-06-15 MED ORDER — OXYTOCIN-SODIUM CHLORIDE 30-0.9 UT/500ML-% IV SOLN: 2.5000 [IU]/h | INTRAVENOUS | Status: DC

## 2022-06-15 MED ORDER — LACTATED RINGERS IV SOLN: 500.0000 mL | INTRAVENOUS | Status: DC | PRN

## 2022-06-15 MED ORDER — TERBUTALINE SULFATE 1 MG/ML IJ SOLN: 0.2500 mg | Freq: Once | INTRAMUSCULAR | Status: DC | PRN

## 2022-06-15 MED ORDER — PENICILLIN G POT IN DEXTROSE 60000 UNIT/ML IV SOLN
3.0000 10*6.[IU] | INTRAVENOUS | Status: DC
  Administered 2022-06-15 – 2022-06-16 (×5): 3 10*6.[IU] via INTRAVENOUS
  Filled 2022-06-15 (×5): qty 50

## 2022-06-15 MED ORDER — MISOPROSTOL 25 MCG QUARTER TABLET
25.0000 ug | ORAL_TABLET | ORAL | Status: AC
  Administered 2022-06-15 (×2): 25 ug via VAGINAL
  Filled 2022-06-15 (×2): qty 1

## 2022-06-15 MED ORDER — DIPHENHYDRAMINE HCL 50 MG/ML IJ SOLN: 12.5000 mg | INTRAMUSCULAR | Status: DC | PRN

## 2022-06-15 MED ORDER — PHENYLEPHRINE 80 MCG/ML (10ML) SYRINGE FOR IV PUSH (FOR BLOOD PRESSURE SUPPORT)
80.0000 ug | PREFILLED_SYRINGE | INTRAVENOUS | Status: DC | PRN
  Filled 2022-06-15: qty 10

## 2022-06-15 MED ORDER — EPHEDRINE 5 MG/ML INJ
10.0000 mg | INTRAVENOUS | Status: DC | PRN
Start: 2022-06-15 — End: 2022-06-16

## 2022-06-15 MED ORDER — OXYTOCIN-SODIUM CHLORIDE 30-0.9 UT/500ML-% IV SOLN
1.0000 m[IU]/min | INTRAVENOUS | Status: DC
  Administered 2022-06-15: 2 m[IU]/min via INTRAVENOUS
  Filled 2022-06-15: qty 500

## 2022-06-15 MED ORDER — FENTANYL-BUPIVACAINE-NACL 0.5-0.125-0.9 MG/250ML-% EP SOLN
12.0000 mL/h | EPIDURAL | Status: DC | PRN
  Administered 2022-06-15: 12 mL/h via EPIDURAL
  Filled 2022-06-15: qty 250

## 2022-06-15 MED ORDER — ONDANSETRON HCL 4 MG/2ML IJ SOLN: 4.0000 mg | Freq: Four times a day (QID) | INTRAMUSCULAR | Status: DC | PRN

## 2022-06-15 NOTE — Anesthesia Procedure Notes (Signed)
Epidural Patient location during procedure: OB Start time: 06/15/2022 4:06 PM End time: 06/15/2022 4:16 PM  Staffing Anesthesiologist: Murvin Natal, MD Performed: anesthesiologist   Preanesthetic Checklist Completed: patient identified, IV checked, site marked, risks and benefits discussed, monitors and equipment checked, pre-op evaluation and timeout performed  Epidural Patient position: sitting Prep: DuraPrep Patient monitoring: heart rate, cardiac monitor, continuous pulse ox and blood pressure Approach: midline Location: L3-L4 Injection technique: LOR air  Needle:  Needle type: Tuohy  Needle gauge: 17 G Needle length: 9 cm Needle insertion depth: 7 cm Catheter type: closed end flexible Catheter size: 19 Gauge Catheter at skin depth: 13 cm Test dose: negative and 1.5% lidocaine with Epi 1:200 K  Assessment Events: blood not aspirated, injection not painful, no injection resistance and negative IV test  Additional Notes Informed consent obtained prior to proceeding including risk of failure, 1% risk of PDPH, risk of minor discomfort and bruising. Discussed alternatives to epidural analgesia and patient desires to proceed.  Timeout performed pre-procedure verifying patient name, procedure, and platelet count.  Patient tolerated procedure well. Reason for block:procedure for pain

## 2022-06-15 NOTE — Anesthesia Preprocedure Evaluation (Signed)
Anesthesia Evaluation  Patient identified by MRN, date of birth, ID band Patient awake    Reviewed: Allergy & Precautions, H&P , NPO status , Patient's Chart, lab work & pertinent test results  History of Anesthesia Complications Negative for: history of anesthetic complications  Airway Mallampati: II  TM Distance: >3 FB Neck ROM: full    Dental no notable dental hx. (+) Teeth Intact   Pulmonary neg pulmonary ROS, former smoker,    Pulmonary exam normal breath sounds clear to auscultation       Cardiovascular negative cardio ROS Normal cardiovascular exam Rhythm:regular Rate:Normal     Neuro/Psych negative neurological ROS  negative psych ROS   GI/Hepatic negative GI ROS, Neg liver ROS,   Endo/Other  Morbid obesity  Renal/GU negative Renal ROS     Musculoskeletal   Abdominal (+) + obese,   Peds  (+) ADHD Hematology  (+) Blood dyscrasia, anemia ,   Anesthesia Other Findings   Reproductive/Obstetrics (+) Pregnancy                             Anesthesia Physical Anesthesia Plan  ASA: 3  Anesthesia Plan: Epidural   Post-op Pain Management:    Induction:   PONV Risk Score and Plan:   Airway Management Planned:   Additional Equipment:   Intra-op Plan:   Post-operative Plan:   Informed Consent: I have reviewed the patients History and Physical, chart, labs and discussed the procedure including the risks, benefits and alternatives for the proposed anesthesia with the patient or authorized representative who has indicated his/her understanding and acceptance.       Plan Discussed with:   Anesthesia Plan Comments:         Anesthesia Quick Evaluation

## 2022-06-15 NOTE — Progress Notes (Signed)
OB Progress Note  S: Patient resting comfortably although feeling mild contractions/cramping. Desires to eat breakfast.  O: BP 136/85   Pulse 87   Temp 98.8 F (37.1 C) (Oral)   Resp 18   Ht 5' 6.5" (1.689 m)   Wt 116.3 kg   LMP 03/30/2021   BMI 40.76 kg/m   FHT: 145bpm, moderate variablity, + accels, - decels Toco: q2 minutes SVE: deferred, previously 0.5/thick/-3 at 5072  A/P: 41 y.o. [id number] @ 64w6dadmitted for induction of labor for cholestasis.  FWB: Cat. I Labor course: S/p Cytotec vaginally 211m x 1 dose at 0415 Pain: Per patient request GBS: Positive -- s/p 1 dose of PCN Anticipate SVD  Okay to eat breakfast this morning.  MeDrema DallasDO

## 2022-06-15 NOTE — Progress Notes (Signed)
OB Progress Note  S: Patient feeling more intensity with contractions  O: BP 139/84   Pulse 78   Temp 98.2 F (36.8 C) (Oral)   Resp 16   Ht 5' 6.5" (1.689 m)   Wt 116.3 kg   LMP 03/30/2021   BMI 40.76 kg/m   FHT: 135bpm, moderate variablity, + accels, - decels Toco: q2-3 minutes SVE: 3/50/high  A/P: 41 y.o. [id number] @ 25w6dadmitted for induction of labor for cholestasis.  FWB: Cat. I Labor course: S/p Cytotec 230m vaginally x 2 doses, will start Pitocin 2x2 Pain: Per patient request  GBS: Positive -- receiving PCN Anticipate SVD  MeDrema DallasDO

## 2022-06-15 NOTE — Progress Notes (Signed)
OB Progress Note  S: Patient resting comfortably with epidural. Consents to AROM.  O: BP 124/69   Pulse 84   Temp 98 F (36.7 C) (Oral)   Resp 16   Ht 5' 6.5" (1.689 m)   Wt 116.3 kg   LMP 03/30/2021   SpO2 98%   BMI 40.76 kg/m   FHT: 135bpm, moderate variablity, + accels, -decels Toco: q1-2 minutes SVE: 4/50/-3, sutures palpated, cervix toward patient right and anterior AROM: Clear, non-odorous  A/P: 41 y.o. [id number] @ 43w6dadmitted for induction of labor for cholestasis.  FWB: Cat. I Labor course: S/p vaginal Cytotec 222m x 2 doses, Pitocin at 63m75min, AROM performed now Pain: Comfortable with epidural GBS: Positive -- receiving PCN Anticipate SVD  MelDrema DallasO

## 2022-06-15 NOTE — H&P (Addendum)
HPI: 41 y.o. [id number] @ 47w6destimated gestational age (as dated by LMP c/w 10 week ultrasound) presents for induction of labor for cholestasis of pregnancy.  Bile acids were 13.7 on 06/08/2022. Discussed patient with MFM (Dr. SDonalee Citrin who recommended delivery at 37 weeks. Unfortunately, L&D did not have any openings for induction of labor on 8/30 or 8/31 (daytime or midnight slots). Due to this and given cholestasis with AMA status (41yo), decision was made to proceed with IOL on 8/29.  Leakage of fluid:  No Vaginal bleeding:  No Contractions:  No Fetal movement:  Yes  Prenatal care has been provided [facility] MDrema [facility]  ROS:  Denies fevers, chills, chest pain, visual changes, SOB, RUQ/epigastric pain, N/V, dysuria, hematuria, or sudden onset/worsening bilateral LE or facial edema.  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)  Prenatal Transfer Tool  Maternal Diabetes: No Genetic Screening: Normal - Low risk female panorama Maternal Ultrasounds/Referrals: Normal Fetal Ultrasounds or other Referrals:  Other: Serial growth and fetal testing Maternal Substance Abuse:  Yes:  Type: Smoker Significant Maternal Medications:  None Significant Maternal Lab Results: Group B Strep positive   Prenatal Labs Blood type:  A Positive Antibody screen:  Negative CBC:  H/H 10.2/30.6 Rubella: Immune RPR:  Non-reactive Hep B:  Negative Hep C:  Negative HIV:  Negative GC/CT:  Negative Glucola:  84.6 (wnl)  Immunizations: Tdap: Given prenatally Flu: Declines  OBHx:  OB History     Gravida  6   Para  3   Term  3   Preterm      AB  2    Living  3      SAB  1   IAB  1   Ectopic      Multiple      Live Births  3          PMHx:  See above Meds:  PNV, ASA '81mg'$  daily Allergy:   Allergies  Allergen Reactions   Avelox [Moxifloxacin Hcl] Hives   Strattera [Atomoxetine] Other (See Comments)    Migraines   SurgHx: None SocHx:   Denies Tobacco, ETOH, illicit drugs  O: LMP 026/33/3545 Gen. AAOx3, NAD CV.  RRR  Resp. CTAB, no wheezes/rales/rhonchi Abd. Gravid, soft, non-tender throughout, no rebound/guarding Extr.  Trace bilateral LE edema, no calf tenderness bilaterally SVE (06/14/22): 0.5/thick/high   Last [place](06/14/22): BPP 162/56 fetus cephalic  Last growth [place](06/14/22): 37w5dEFW 3833g, 8lb 7oz (>9>38% AAFV, cephalic, anterior placenta  NST (06/14/22): Reactive   Labs: see orders  A/P:  [age].o. [id number] 3662w6do presents for induction of labor for cholestasis.  - Admit to L&D - Admit labs (CBC, T&S, COVID screen) - CEFM/Toco - FWB:  BPP 10/10 - Diet:  Clear liquids - IVF:  LR at 125cc/hour - VTE Prophylaxis:  SCDs - GBS Status:  Positive - PCN ordered - Presentation:  Cephalic on [place] control:  Per patient request - Induction method:  Cytotec vaginally for cervical ripening followed by Pitocin - Anticipate SVD  MelDrema DallasO [phone])

## 2022-06-15 NOTE — Progress Notes (Signed)
Tracing reviewed remotely cat 1 Ctxs q2-86mn

## 2022-06-16 ENCOUNTER — Encounter (HOSPITAL_COMMUNITY): Payer: Self-pay | Admitting: Obstetrics and Gynecology

## 2022-06-16 ENCOUNTER — Encounter (HOSPITAL_COMMUNITY)
Admission: AD | Disposition: A | Discharge status: Home / Self Care | Payer: Self-pay | Source: Home / Self Care | Attending: Obstetrics and Gynecology

## 2022-06-16 ENCOUNTER — Other Ambulatory Visit: Payer: Self-pay

## 2022-06-16 DIAGNOSIS — O99214 Obesity complicating childbirth: Secondary | ICD-10-CM

## 2022-06-16 DIAGNOSIS — O99334 Smoking (tobacco) complicating childbirth: Secondary | ICD-10-CM

## 2022-06-16 DIAGNOSIS — Z3A37 37 weeks gestation of pregnancy: Secondary | ICD-10-CM

## 2022-06-16 DIAGNOSIS — Z3A36 36 weeks gestation of pregnancy: Secondary | ICD-10-CM

## 2022-06-16 DIAGNOSIS — O9902 Anemia complicating childbirth: Secondary | ICD-10-CM

## 2022-06-16 DIAGNOSIS — O2662 Liver and biliary tract disorders in childbirth: Secondary | ICD-10-CM

## 2022-06-16 DIAGNOSIS — O99820 Streptococcus B carrier state complicating pregnancy: Secondary | ICD-10-CM

## 2022-06-16 DIAGNOSIS — D649 Anemia, unspecified: Secondary | ICD-10-CM

## 2022-06-16 DIAGNOSIS — O09523 Supervision of elderly multigravida, third trimester: Secondary | ICD-10-CM

## 2022-06-16 SURGERY — Surgical Case
Anesthesia: Epidural | Wound class: Clean Contaminated

## 2022-06-16 MED ORDER — OXYCODONE HCL 5 MG PO TABS
5.0000 mg | ORAL_TABLET | ORAL | Status: DC | PRN
  Administered 2022-06-17: 5 mg via ORAL
  Administered 2022-06-18 (×4): 10 mg via ORAL
  Administered 2022-06-19: 5 mg via ORAL
  Filled 2022-06-16 (×3): qty 2
  Filled 2022-06-16 (×2): qty 1
  Filled 2022-06-16: qty 2

## 2022-06-16 MED ORDER — KETAMINE HCL 50 MG/5ML IJ SOSY
PREFILLED_SYRINGE | INTRAMUSCULAR | Status: AC
  Filled 2022-06-16: qty 5

## 2022-06-16 MED ORDER — IBUPROFEN 600 MG PO TABS
600.0000 mg | ORAL_TABLET | Freq: Four times a day (QID) | ORAL | Status: DC
  Administered 2022-06-17 – 2022-06-19 (×9): 600 mg via ORAL
  Filled 2022-06-16 (×8): qty 1

## 2022-06-16 MED ORDER — DEXAMETHASONE SODIUM PHOSPHATE 4 MG/ML IJ SOLN
INTRAMUSCULAR | Status: AC
  Filled 2022-06-16: qty 1

## 2022-06-16 MED ORDER — FAMOTIDINE IN NACL 20-0.9 MG/50ML-% IV SOLN
20.0000 mg | Freq: Two times a day (BID) | INTRAVENOUS | Status: DC
  Administered 2022-06-16: 20 mg via INTRAVENOUS
  Filled 2022-06-16 (×2): qty 50

## 2022-06-16 MED ORDER — KETOROLAC TROMETHAMINE 30 MG/ML IJ SOLN
30.0000 mg | Freq: Four times a day (QID) | INTRAMUSCULAR | Status: AC
  Administered 2022-06-16 – 2022-06-17 (×4): 30 mg via INTRAVENOUS
  Filled 2022-06-16 (×4): qty 1

## 2022-06-16 MED ORDER — PHENYLEPHRINE HCL (PRESSORS) 10 MG/ML IV SOLN
INTRAVENOUS | Status: DC | PRN
  Administered 2022-06-16 (×2): 160 ug via INTRAVENOUS

## 2022-06-16 MED ORDER — NALOXONE HCL 0.4 MG/ML IJ SOLN: 0.4000 mg | INTRAMUSCULAR | Status: DC | PRN

## 2022-06-16 MED ORDER — FENTANYL CITRATE (PF) 100 MCG/2ML IJ SOLN
INTRAMUSCULAR | Status: DC | PRN
  Administered 2022-06-16: 100 ug via INTRAVENOUS

## 2022-06-16 MED ORDER — ACETAMINOPHEN 10 MG/ML IV SOLN
1000.0000 mg | Freq: Once | INTRAVENOUS | Status: AC
  Administered 2022-06-16: 1000 mg via INTRAVENOUS

## 2022-06-16 MED ORDER — NALOXONE HCL 4 MG/10ML IJ SOLN: 1.0000 ug/kg/h | INTRAVENOUS | Status: DC | PRN

## 2022-06-16 MED ORDER — DEXAMETHASONE SODIUM PHOSPHATE 4 MG/ML IJ SOLN
INTRAMUSCULAR | Status: DC | PRN
  Administered 2022-06-16: 4 mg via INTRAVENOUS

## 2022-06-16 MED ORDER — KETAMINE HCL 10 MG/ML IJ SOLN
INTRAMUSCULAR | Status: DC | PRN
  Administered 2022-06-16: 10 mg via INTRAVENOUS

## 2022-06-16 MED ORDER — LACTATED RINGERS IV SOLN: INTRAVENOUS | Status: DC | PRN

## 2022-06-16 MED ORDER — DIPHENHYDRAMINE HCL 25 MG PO CAPS
25.0000 mg | ORAL_CAPSULE | ORAL | Status: DC | PRN
  Administered 2022-06-18: 25 mg via ORAL
  Filled 2022-06-16: qty 1

## 2022-06-16 MED ORDER — ONDANSETRON HCL 4 MG/2ML IJ SOLN
4.0000 mg | Freq: Three times a day (TID) | INTRAMUSCULAR | Status: DC | PRN
  Administered 2022-06-16: 4 mg via INTRAVENOUS
  Filled 2022-06-16: qty 2

## 2022-06-16 MED ORDER — SODIUM CHLORIDE 0.9 % IV SOLN
INTRAVENOUS | Status: AC
  Filled 2022-06-16: qty 5

## 2022-06-16 MED ORDER — SODIUM CHLORIDE 0.9% FLUSH: 3.0000 mL | INTRAVENOUS | Status: DC | PRN

## 2022-06-16 MED ORDER — CEFAZOLIN SODIUM-DEXTROSE 2-4 GM/100ML-% IV SOLN
INTRAVENOUS | Status: AC
  Filled 2022-06-16: qty 100

## 2022-06-16 MED ORDER — ACETAMINOPHEN 500 MG PO TABS: 1000.0000 mg | ORAL_TABLET | Freq: Once | ORAL | Status: DC

## 2022-06-16 MED ORDER — MORPHINE SULFATE (PF) 0.5 MG/ML IJ SOLN
INTRAMUSCULAR | Status: AC
  Filled 2022-06-16: qty 10

## 2022-06-16 MED ORDER — FENTANYL CITRATE (PF) 100 MCG/2ML IJ SOLN
INTRAMUSCULAR | Status: AC
  Filled 2022-06-16: qty 2

## 2022-06-16 MED ORDER — SCOPOLAMINE 1 MG/3DAYS TD PT72
1.0000 | MEDICATED_PATCH | TRANSDERMAL | Status: DC
  Administered 2022-06-16: 1.5 mg via TRANSDERMAL
  Filled 2022-06-16: qty 1

## 2022-06-16 MED ORDER — TETANUS-DIPHTH-ACELL PERTUSSIS 5-2.5-18.5 LF-MCG/0.5 IM SUSY: 0.5000 mL | PREFILLED_SYRINGE | Freq: Once | INTRAMUSCULAR | Status: DC

## 2022-06-16 MED ORDER — KETOROLAC TROMETHAMINE 30 MG/ML IJ SOLN: 30.0000 mg | Freq: Four times a day (QID) | INTRAMUSCULAR | Status: AC | PRN

## 2022-06-16 MED ORDER — OXYTOCIN-SODIUM CHLORIDE 30-0.9 UT/500ML-% IV SOLN
INTRAVENOUS | Status: DC | PRN
  Administered 2022-06-16: 30 [IU] via INTRAVENOUS

## 2022-06-16 MED ORDER — OXYTOCIN-SODIUM CHLORIDE 30-0.9 UT/500ML-% IV SOLN
2.5000 [IU]/h | INTRAVENOUS | Status: AC
  Administered 2022-06-16: 2.5 [IU]/h via INTRAVENOUS
  Filled 2022-06-16: qty 500

## 2022-06-16 MED ORDER — CHLOROPROCAINE HCL (PF) 3 % IJ SOLN
INTRAMUSCULAR | Status: DC | PRN
  Administered 2022-06-16: 20 mL

## 2022-06-16 MED ORDER — TRANEXAMIC ACID-NACL 1000-0.7 MG/100ML-% IV SOLN
INTRAVENOUS | Status: AC
  Filled 2022-06-16: qty 100

## 2022-06-16 MED ORDER — MENTHOL 3 MG MT LOZG: 1.0000 | LOZENGE | OROMUCOSAL | Status: DC | PRN

## 2022-06-16 MED ORDER — ACETAMINOPHEN 10 MG/ML IV SOLN
INTRAVENOUS | Status: AC
  Filled 2022-06-16: qty 100

## 2022-06-16 MED ORDER — ACETAMINOPHEN 500 MG PO TABS
1000.0000 mg | ORAL_TABLET | Freq: Four times a day (QID) | ORAL | Status: DC
  Administered 2022-06-17 – 2022-06-19 (×8): 1000 mg via ORAL
  Filled 2022-06-16 (×12): qty 2

## 2022-06-16 MED ORDER — DEXMEDETOMIDINE HCL IN NACL 80 MCG/20ML IV SOLN
INTRAVENOUS | Status: AC
  Filled 2022-06-16: qty 20

## 2022-06-16 MED ORDER — SIMETHICONE 80 MG PO CHEW
80.0000 mg | CHEWABLE_TABLET | Freq: Three times a day (TID) | ORAL | Status: DC
  Administered 2022-06-16 – 2022-06-19 (×9): 80 mg via ORAL
  Filled 2022-06-16 (×10): qty 1

## 2022-06-16 MED ORDER — TRANEXAMIC ACID-NACL 1000-0.7 MG/100ML-% IV SOLN
INTRAVENOUS | Status: DC | PRN
  Administered 2022-06-16: 1000 mg via INTRAVENOUS

## 2022-06-16 MED ORDER — SODIUM CHLORIDE 0.9 % IR SOLN
Status: DC | PRN
  Administered 2022-06-16: 1

## 2022-06-16 MED ORDER — ACETAMINOPHEN 160 MG/5ML PO SOLN: 1000.0000 mg | Freq: Once | ORAL | Status: DC

## 2022-06-16 MED ORDER — ONDANSETRON HCL 4 MG/2ML IJ SOLN
INTRAMUSCULAR | Status: AC
  Filled 2022-06-16: qty 2

## 2022-06-16 MED ORDER — DIBUCAINE (PERIANAL) 1 % EX OINT: 1.0000 | TOPICAL_OINTMENT | CUTANEOUS | Status: DC | PRN

## 2022-06-16 MED ORDER — COCONUT OIL OIL: 1.0000 | TOPICAL_OIL | Status: DC | PRN

## 2022-06-16 MED ORDER — PRENATAL MULTIVITAMIN CH
1.0000 | ORAL_TABLET | Freq: Every day | ORAL | Status: DC
Start: 2022-06-16 — End: 2022-06-19
  Administered 2022-06-16 – 2022-06-19 (×4): 1 via ORAL
  Filled 2022-06-16 (×3): qty 1

## 2022-06-16 MED ORDER — SIMETHICONE 80 MG PO CHEW
80.0000 mg | CHEWABLE_TABLET | ORAL | Status: DC | PRN
  Administered 2022-06-18: 80 mg via ORAL
  Filled 2022-06-16: qty 1

## 2022-06-16 MED ORDER — ACETAMINOPHEN 500 MG PO TABS
1000.0000 mg | ORAL_TABLET | Freq: Four times a day (QID) | ORAL | Status: AC
  Administered 2022-06-16 – 2022-06-17 (×3): 1000 mg via ORAL
  Filled 2022-06-16 (×2): qty 2

## 2022-06-16 MED ORDER — STERILE WATER FOR IRRIGATION IR SOLN
Status: DC | PRN
  Administered 2022-06-16: 1000 mL

## 2022-06-16 MED ORDER — MORPHINE SULFATE (PF) 0.5 MG/ML IJ SOLN
INTRAMUSCULAR | Status: DC | PRN
  Administered 2022-06-16: 3 mg via EPIDURAL

## 2022-06-16 MED ORDER — FENTANYL CITRATE (PF) 100 MCG/2ML IJ SOLN: INTRAMUSCULAR | Status: DC | PRN

## 2022-06-16 MED ORDER — MIDAZOLAM HCL 2 MG/2ML IJ SOLN
INTRAMUSCULAR | Status: AC
  Filled 2022-06-16: qty 2

## 2022-06-16 MED ORDER — SENNOSIDES-DOCUSATE SODIUM 8.6-50 MG PO TABS
2.0000 | ORAL_TABLET | Freq: Every day | ORAL | Status: DC
Start: 2022-06-17 — End: 2022-06-19
  Administered 2022-06-17 – 2022-06-19 (×3): 2 via ORAL
  Filled 2022-06-16 (×2): qty 2

## 2022-06-16 MED ORDER — SODIUM CHLORIDE 0.9 % IV SOLN: 25.0000 mg | Freq: Four times a day (QID) | INTRAVENOUS | Status: DC | PRN

## 2022-06-16 MED ORDER — FENTANYL CITRATE (PF) 100 MCG/2ML IJ SOLN
INTRAMUSCULAR | Status: DC | PRN
  Administered 2022-06-16: 100 ug via EPIDURAL

## 2022-06-16 MED ORDER — SODIUM CHLORIDE 0.9 % IV SOLN
INTRAVENOUS | Status: DC | PRN
  Administered 2022-06-16: 500 mg via INTRAVENOUS

## 2022-06-16 MED ORDER — ZOLPIDEM TARTRATE 5 MG PO TABS: 5.0000 mg | ORAL_TABLET | Freq: Every evening | ORAL | Status: DC | PRN

## 2022-06-16 MED ORDER — DEXMEDETOMIDINE (PRECEDEX) IN NS 20 MCG/5ML (4 MCG/ML) IV SYRINGE
PREFILLED_SYRINGE | INTRAVENOUS | Status: DC | PRN
  Administered 2022-06-16: 4 ug via INTRAVENOUS
  Administered 2022-06-16 (×2): 8 ug via INTRAVENOUS

## 2022-06-16 MED ORDER — DIPHENHYDRAMINE HCL 50 MG/ML IJ SOLN: 12.5000 mg | INTRAMUSCULAR | Status: DC | PRN

## 2022-06-16 MED ORDER — CEFAZOLIN SODIUM-DEXTROSE 2-3 GM-%(50ML) IV SOLR
INTRAVENOUS | Status: DC | PRN
  Administered 2022-06-16: 2 g via INTRAVENOUS

## 2022-06-16 MED ORDER — OXYTOCIN-SODIUM CHLORIDE 30-0.9 UT/500ML-% IV SOLN
INTRAVENOUS | Status: AC
  Filled 2022-06-16: qty 500

## 2022-06-16 MED ORDER — KETOROLAC TROMETHAMINE 30 MG/ML IJ SOLN: 30.0000 mg | Freq: Once | INTRAMUSCULAR | Status: DC

## 2022-06-16 MED ORDER — MIDAZOLAM HCL 2 MG/2ML IJ SOLN
INTRAMUSCULAR | Status: DC | PRN
  Administered 2022-06-16: 1 mg via INTRAVENOUS

## 2022-06-16 MED ORDER — FENTANYL CITRATE (PF) 100 MCG/2ML IJ SOLN
25.0000 ug | INTRAMUSCULAR | Status: DC | PRN
  Administered 2022-06-16: 50 ug via INTRAVENOUS

## 2022-06-16 MED ORDER — ENOXAPARIN SODIUM 60 MG/0.6ML IJ SOSY
60.0000 mg | PREFILLED_SYRINGE | INTRAMUSCULAR | Status: DC
  Administered 2022-06-16 – 2022-06-18 (×3): 60 mg via SUBCUTANEOUS
  Filled 2022-06-16 (×3): qty 0.6

## 2022-06-16 MED ORDER — LIDOCAINE-EPINEPHRINE (PF) 2 %-1:200000 IJ SOLN
INTRAMUSCULAR | Status: DC | PRN
  Administered 2022-06-16: 5 mL via EPIDURAL
  Administered 2022-06-16: 2 mL via EPIDURAL
  Administered 2022-06-16: 5 mL via EPIDURAL
  Administered 2022-06-16: 3 mL via EPIDURAL

## 2022-06-16 MED ORDER — ONDANSETRON HCL 4 MG/2ML IJ SOLN
INTRAMUSCULAR | Status: DC | PRN
  Administered 2022-06-16: 4 mg via INTRAVENOUS

## 2022-06-16 MED ORDER — DROPERIDOL 2.5 MG/ML IJ SOLN: 0.6250 mg | Freq: Once | INTRAMUSCULAR | Status: DC | PRN

## 2022-06-16 MED ORDER — WITCH HAZEL-GLYCERIN EX PADS: 1.0000 | MEDICATED_PAD | CUTANEOUS | Status: DC | PRN

## 2022-06-16 MED ORDER — DIPHENHYDRAMINE HCL 25 MG PO CAPS: 25.0000 mg | ORAL_CAPSULE | Freq: Four times a day (QID) | ORAL | Status: DC | PRN

## 2022-06-16 SURGICAL SUPPLY — 35 items
BENZOIN TINCTURE PRP APPL 2/3 (GAUZE/BANDAGES/DRESSINGS) ×1 IMPLANT
CHLORAPREP W/TINT 26ML (MISCELLANEOUS) ×2 IMPLANT
CLAMP CORD UMBIL (MISCELLANEOUS) ×1 IMPLANT
CLOTH BEACON ORANGE TIMEOUT ST (SAFETY) ×1 IMPLANT
DRSG OPSITE POSTOP 4X10 (GAUZE/BANDAGES/DRESSINGS) ×1 IMPLANT
ELECT REM PT RETURN 9FT ADLT (ELECTROSURGICAL)
ELECTRODE REM PT RTRN 9FT ADLT (ELECTROSURGICAL) ×1 IMPLANT
EXTRACTOR VACUUM M CUP 4 TUBE (SUCTIONS) IMPLANT
GLOVE BIO SURGEON STRL SZ7.5 (GLOVE) ×1 IMPLANT
GLOVE BIOGEL PI IND STRL 7.0 (GLOVE) ×1 IMPLANT
GLOVE BIOGEL PI IND STRL 7.5 (GLOVE) ×1 IMPLANT
GLOVE BIOGEL PI INDICATOR 7.0 (GLOVE) ×1
GLOVE BIOGEL PI INDICATOR 7.5 (GLOVE) ×1
GOWN STRL REUS W/TWL LRG LVL3 (GOWN DISPOSABLE) ×2 IMPLANT
HEMOSTAT ARISTA ABSORB 3G PWDR (HEMOSTASIS) IMPLANT
KIT ABG SYR 3ML LUER SLIP (SYRINGE) IMPLANT
NDL HYPO 25X5/8 SAFETYGLIDE (NEEDLE) IMPLANT
NEEDLE HYPO 25X5/8 SAFETYGLIDE (NEEDLE) IMPLANT
NS IRRIG 1000ML POUR BTL (IV SOLUTION) ×1 IMPLANT
PACK C SECTION WH (CUSTOM PROCEDURE TRAY) ×1 IMPLANT
PAD OB MATERNITY 4.3X12.25 (PERSONAL CARE ITEMS) ×1 IMPLANT
RTRCTR C-SECT PINK 25CM LRG (MISCELLANEOUS) ×1 IMPLANT
STRIP CLOSURE SKIN 1/2X4 (GAUZE/BANDAGES/DRESSINGS) ×1 IMPLANT
SUT CHROMIC 2 0 CT 1 (SUTURE) ×1 IMPLANT
SUT MNCRL AB 3-0 PS2 27 (SUTURE) ×1 IMPLANT
SUT MON AB 4-0 PS1 27 (SUTURE) IMPLANT
SUT PLAIN 2 0 XLH (SUTURE) ×1 IMPLANT
SUT VIC AB 0 CT1 36 (SUTURE) ×1 IMPLANT
SUT VIC AB 0 CTX 36 (SUTURE) ×4
SUT VIC AB 0 CTX36XBRD ANBCTRL (SUTURE) ×3 IMPLANT
SUT VIC AB 2-0 SH 27 (SUTURE)
SUT VIC AB 2-0 SH 27XBRD (SUTURE) IMPLANT
TOWEL OR 17X24 6PK STRL BLUE (TOWEL DISPOSABLE) ×1 IMPLANT
TRAY FOLEY W/BAG SLVR 14FR LF (SET/KITS/TRAYS/PACK) ×1 IMPLANT
WATER STERILE IRR 1000ML POUR (IV SOLUTION) ×1 IMPLANT

## 2022-06-16 NOTE — Transfer of Care (Signed)
Immediate Anesthesia Transfer of Care Note  Patient: Alicia Burch  Procedure(s) Performed: CESAREAN SECTION  Patient Location: PACU  Anesthesia Type:Epidural  Level of Consciousness: awake, alert  and patient cooperative  Airway & Oxygen Therapy: Patient Spontanous Breathing  Post-op Assessment: Report given to RN and Post -op Vital signs reviewed and stable  Post vital signs: Reviewed and stable  Last Vitals:  Vitals Value Taken Time  BP 118/47 06/16/22 0649  Temp    Pulse 89 06/16/22 0651  Resp 17 06/16/22 0651  SpO2 100 % 06/16/22 0651  Vitals shown include unvalidated device data.  Last Pain:  Vitals:   06/16/22 0115  TempSrc: Axillary  PainSc:          Complications: No notable events documented.

## 2022-06-16 NOTE — Progress Notes (Signed)
Alicia Burch is a 41 y.o. [id number] at [redacted]w[redacted]d  Subjective: Pt requesting c-section d/t back pain and rt sided pelvic pain and tired of pushing.  Objective: BP 124/65   Pulse 79   Temp 98.1 F (36.7 C) (Axillary)   Resp 16   Ht 5' 6.5" (1.689 m)   Wt 116.3 kg   LMP 03/30/2021   SpO2 100%   BMI 40.76 kg/m  No intake/output data recorded. Total I/O In: -  Out: 12257[Urine:1550]  FHT:  FHR: 150 bpm, variability: moderate,  accelerations:  Present,  decelerations:  Absent since pitocin started but had been having variables and questionable late decelerations with episode of tachysytole. UC:   regular, every 3 minutes SVE:   Dilation: 10 Effacement (%): 100 Station: -1 Exam by:: Dr. RMancel Bale Labs: Lab Results  Component Value Date   WBC 11.3 (H) 06/15/2022   HGB 9.8 (L) 06/15/2022   HCT 28.6 (L) 06/15/2022   MCV 82.9 06/15/2022   PLT 228 06/15/2022    Assessment / Plan: Arrest of decent  Labor:  Pt has been pushing for about 2hrs and requesting c-section for reasons stated above but there has also been very little progress since she started pushing.  Risks benefits alternatives reviewed with the patient including but not limited to bleeding infection and injury.  Questions answered and consent signed and witnessed. Preeclampsia:   n/a Fetal Wellbeing:  Category I but has been Cat 2 as well  Pain Control:  Epidural I/D:   GBS positive on PCN.  No s/sxs of infection. Anticipated MOD:   c-section  ADelice Lesch MD 06/16/2022, 5:12 AM

## 2022-06-16 NOTE — Plan of Care (Signed)
  Problem: Education: Goal: Knowledge of Childbirth will improve Outcome: Completed/Met Goal: Ability to make informed decisions regarding treatment and plan of care will improve Outcome: Completed/Met Goal: Ability to state and carry out methods to decrease the pain will improve Outcome: Completed/Met Goal: Individualized Educational Video(s) Outcome: Completed/Met

## 2022-06-16 NOTE — Progress Notes (Signed)
Patient nauseated. Zofran given but not effective. Called Dr. Delora Fuel, who ordered to place scope patch and to give phenergan as needed. See orders. Maxwell Caul, Leretha Dykes Tabor

## 2022-06-16 NOTE — Anesthesia Postprocedure Evaluation (Signed)
Anesthesia Post Note  Patient: Alicia Burch  Procedure(s) Performed: [place]     Patient location during evaluation: Mother Baby Anesthesia Type: Epidural Level of consciousness: awake and alert Pain management: pain level controlled Vital Signs Assessment: post-procedure vital signs reviewed and stable Respiratory status: spontaneous breathing, nonlabored ventilation and respiratory function stable Cardiovascular status: stable Postop Assessment: no headache, no backache and epidural receding Anesthetic complications: no   No notable events documented.  Last Vitals:  Vitals:   06/16/22 0806 06/16/22 0900  BP: (!) 105/92 128/78  Pulse: 83 83  Resp: 15 18  Temp: 36.8 C 36.7 C  SpO2: 97% 96%    Last Pain:  Vitals:   06/16/22 0900  TempSrc: Oral  PainSc: 0-No pain   Pain Goal: Patients Stated Pain Goal: 5 (06/16/22 0700)                 Barkley Boards

## 2022-06-16 NOTE — Lactation Note (Signed)
This note was copied from a baby's chart. Lactation Consultation Note  Patient Name: Alicia Burch Date: 06/16/2022 Reason for consult: Initial assessment;Early term 37-38.6wks Age:40 hours   P4: Early term infant at 37_0 weeks Feeding preference: Breast  Baby was asleep STS on birth parent's chest when I arrived.  He had recently breast fed for 30 minutes.    Breast feeding basics reviewed.  Birth parent did not have any questions/concerns at this time.  She did not wish to review hand expression and feels like she does not need any assistance.  Due to the longevity since her last breast fed child, I will leave her status as a "follow up" for tomorrow so we can check on her progress, especially since this is a 37+0 week baby.    Encouraged to call insurance company today to obtain electric pump.  Support person present.   Maternal Data Has patient been taught Hand Expression?: Yes Does the patient have breastfeeding experience prior to this delivery?: Yes How long did the patient breastfeed?: Couple of months with her other three children (ages 34-23)  Feeding Mother's Current Feeding Choice: Breast Milk  LATCH Score Latch: Repeated attempts needed to sustain latch, nipple held in mouth throughout feeding, stimulation needed to elicit sucking reflex.  Audible Swallowing: None  Type of Nipple: Everted at rest and after stimulation  Comfort (Breast/Nipple): Soft / non-tender  Hold (Positioning): Assistance needed to correctly position infant at breast and maintain latch.  LATCH Score: 6   Lactation Tools Discussed/Used    Interventions    Discharge Pump: Personal;Advised to call insurance company (Birth parent will follow up and call insurance company to obtain electric pump) [place] Program: No  Consult Status Consult Status: Follow-up Date: 06/17/22 Follow-up type: In-patient    Little Ishikawa 06/16/2022, 9:43 AM

## 2022-06-16 NOTE — Op Note (Signed)
Cesarean Section Procedure Note  Indications: P3 at 37wks going for primary c-section d/t arrest of descent and pt request d/t back and right sided pain and tired of pushing.  Pre-operative Diagnosis: 1.37wks 2.Arrest of Descent   Post-operative Diagnosis: 1.37wks 2.Arrest of Descent  Procedure: Primary Low Transverse Cesarean Section  Surgeon: Drema Dallas, DO    Assistants: Dr. Marella Bile   Anesthesia: Spinal  Anesthesiologist: Brennan Bailey, MD   Procedure Details  The patient was taken to the operating room secondary to arrest of descent after the risks, benefits, complications, treatment options, and expected outcomes were discussed with the patient.  The patient concurred with the proposed plan, giving informed consent which was signed and witnessed. The patient was taken to Operating Room B, identified as Brayley Mackowiak and the procedure verified as C-Section Delivery. A Time Out was held and the above information confirmed.  After induction of anesthesia by obtaining a spinal, the patient was prepped and draped in the usual sterile manner. A Pfannenstiel skin incision was made and carried down through the subcutaneous tissue to the underlying layer of fascia.  The fascia was incised bilaterally and extended transversely bilaterally with the Mayo scissors. Kocher clamps were placed on the inferior aspect of the fascial incision and the underlying rectus muscle was separated from the fascia. The same was done on the superior aspect of the fascial incision.  The peritoneum was identified, entered bluntly and extended manually.  An Alexis self-retaining retractor was placed.  The utero-vesical peritoneal reflection was incised transversely and the bladder flap was bluntly freed from the lower uterine segment. A low transverse uterine incision was made with the scalpel and extended bilaterally with the bandage scissors.  The infant was delivered in vertex position without  difficulty.  After the umbilical cord was clamped and cut, the infant was handed to the awaiting pediatricians.  Cord blood was obtained for evaluation.  The placenta was removed intact and appeared to be within normal limits. The uterus was cleared of all clots and debris. The uterine incision was closed with running interlocking sutures of 0 Monocryl and a second imbricating layer was performed as well.   Figure of stitch placed at left angle with good hemostasis.  Arista placed along incision. Bilateral tubes and ovaries appeared to be within normal limits.  Good hemostasis was noted.  Copious irrigation was performed until clear.  The peritoneum was repaired with 2-0 chromic via a running suture.  The fascia was reapproximated with a running suture of 0 Vicryl. The subcutaneous tissue was reapproximated with 3 interrupted sutures of 2-0 plain.  The skin was reapproximated with a subcuticular suture of 4-0 monocryl.  Steristrips were applied.  Instrument, sponge, and needle counts were correct prior to abdominal closure and at the conclusion of the case.  The patient was awaiting transfer to the recovery room in good condition.  Findings: Live female infant with Apgars 8 at one minute and 9 at five minutes.  Normal appearing bilateral ovaries and fallopian tubes were noted.  Estimated Blood Loss:  857 ml         Drains: foley to gravity, concentrated urine, 100 cc         Total IV Fluids: 1750 ml         Specimens to Pathology: Placenta         Complications:  None; patient tolerated the procedure well.         Disposition: PACU - hemodynamically stable.  Condition: stable  Attending Attestation: I performed the procedure.  I was present and scrubbed and the assistant was required due to complexity of anatomy.

## 2022-06-17 LAB — SURGICAL PATHOLOGY

## 2022-06-17 LAB — CBC
HCT: 22.1 % — ABNORMAL LOW (ref 36.0–46.0)
Hemoglobin: 7.6 g/dL — ABNORMAL LOW (ref 12.0–15.0)
MCH: 28.7 pg (ref 26.0–34.0)
MCHC: 34.4 g/dL (ref 30.0–36.0)
MCV: 83.4 fL (ref 80.0–100.0)
Platelets: 174 10*3/uL (ref 150–400)
RBC: 2.65 MIL/uL — ABNORMAL LOW (ref 3.87–5.11)
RDW: 13.5 % (ref 11.5–15.5)
WBC: 11.7 10*3/uL — ABNORMAL HIGH (ref 4.0–10.5)
nRBC: 0 % (ref 0.0–0.2)

## 2022-06-17 LAB — BASIC METABOLIC PANEL
Anion gap: 8 (ref 5–15)
BUN: 9 mg/dL (ref 6–20)
CO2: 24 mmol/L (ref 22–32)
Calcium: 8.3 mg/dL — ABNORMAL LOW (ref 8.9–10.3)
Chloride: 102 mmol/L (ref 98–111)
Creatinine, Ser: 0.76 mg/dL (ref 0.44–1.00)
GFR, Estimated: >60 mL/min (ref >60)
Glucose, Bld: 100 mg/dL — ABNORMAL HIGH (ref 70–99)
Potassium: 3.7 mmol/L (ref 3.5–5.1)
Sodium: 134 mmol/L — ABNORMAL LOW (ref 135–145)

## 2022-06-17 MED ORDER — FERROUS SULFATE 325 (65 FE) MG PO TABS
325.0000 mg | ORAL_TABLET | Freq: Two times a day (BID) | ORAL | Status: DC
  Administered 2022-06-17 – 2022-06-19 (×4): 325 mg via ORAL
  Filled 2022-06-17 (×4): qty 1

## 2022-06-17 NOTE — Lactation Note (Signed)
This note was copied from a baby's chart. Lactation Consultation Note  Patient Name: Alicia Burch [id number] Date: 06/17/2022 Reason for consult: Follow-up assessment;Mother's request;Early term 37-38.6wks Age:40 hours   P4: Early term infant at 37+0 weeks Feeding preference: Breast/donor breast milk  After speaking with support person when birth parent was asleep, he was amenable to beginning supplementation with donor breast milk.  RN initiated this with birth parent and "Darin" consumed his first volume of 20 mls of donor milk.   Discussed the benefits of beginning to pump with birth parent and she is now ready to start pumping.  Initiated the electric pump; pump parts, assembly and cleaning reviewed.  Started with the #24 flanges, however, during pumping her nipples enlarged quickly and changed her to #27 flanges for better fit and comfort.  Observed her pumping for 15 minutes; one colostrum drop obtained.  Both nipple tips reddened and irritated from shallow latches.  Suggested colostrum drops followed by hydrogel pads.  Birth parent appreciative.  Encouraged to feed at least every three hours and sooner if "Darin" shows cues.  Birth parent will latch, support person will supplement with donor milk while birth parent pumps.  Volume supplementation guidelines provided and informed parents that if "Darin" desires more than the guidelines suggest, let him feed more in increments of 5 mls.  Explained that they may need to transition to formula during the night if the donor milk supply is depleted.  Parents verbalized understanding.  No further questions at this time.  Again, encouraged support person to call insurance company now to determine pump eligibility.  Birth parent desires to know her pump choices; family has just not had the opportunity to call yet.  If pump cannot be delivered overnight, may need to offer the [facility] pump option in the a.m.  Parents will consider insurance choices.   RN updated.   Maternal Data Has patient been taught Hand Expression?: Yes Does the patient have breastfeeding experience prior to this delivery?: Yes How long did the patient breastfeed?: Couple of months with her other three children  Feeding Mother's Current Feeding Choice: Breast Milk and Donor Milk Nipple Type: Slow - flow  LATCH Score                    Lactation Tools Discussed/Used Tools: Pump;Flanges;Comfort gels Flange Size: 27 Breast pump type: Double-Electric Breast Pump;Manual Pump Education: Setup, frequency, and cleaning;Milk Storage Reason for Pumping: Breast stimulation for supplementation Pumping frequency: Every three hours Pumped volume: 0 mL  Interventions    Discharge Pump: Personal;Advised to call insurance company (Suggested parents call yesterday for a pump from insurance.  They have not completed this yet; suggested they call again now since they will need a pump at discharge)  Consult Status Consult Status: Follow-up Date: 06/18/22 Follow-up type: In-patient    Little Ishikawa 06/17/2022, 5:38 PM

## 2022-06-17 NOTE — Progress Notes (Signed)
Subjective: Postpartum Day 1: Cesarean Delivery Patient reports incisional pain, tolerating PO, + flatus, and no problems voiding.   She denies feeling lightheaded or dizzy when ambulating however she does feel weak.   Objective: Vital signs in last 24 hours: Temp:  [97.7 F (36.5 C)-98.2 F (36.8 C)] 97.7 F (36.5 C) (08/31 2006) Pulse Rate:  [78-83] 83 (08/31 2006) Resp:  [16-18] 17 (08/31 2006) BP: (106-130)/(66-76) 130/76 (08/31 2006)  Physical Exam:  General: alert, cooperative, and no distress Lochia: appropriate Uterine Fundus: firm Incision: healing well DVT Evaluation: No evidence of DVT seen on physical exam.  Recent Labs    06/15/22 0358 06/17/22 0504  HGB 9.8* 7.6*  HCT 28.6* 22.1*    Assessment/Plan: Status post Cesarean section. Postoperative course complicated by acute blood loss anemia    Start ferrous sulfate 325 mg bid with meals.  Routine postpartum care .  Christophe Louis, MD 06/17/2022, 11:12 PM

## 2022-06-17 NOTE — Lactation Note (Signed)
This note was copied from a baby's chart. Lactation Consultation Note  Patient Name: Alicia Burch [id number] Date: 06/17/2022 Reason for consult: Difficult latch;Mother's request;Early term 37-38.6wks Age:40 hours, ETI, C/S delivery, Per Birth Parent,  infant not BF since 0830 am yesterday, has made few attempts, sleepy infant. LC discussed latching infant STS, Birth Parent latched infant on the right breast using the cross cradle hold, infant BF for 15 minutes. LC discussed hand expression if infant doesn't latch, LC used breast model and Birth Parent self expressed, infant given 2 mls of colostrum by spoon. LC discussed with RN/ Birth Parent if infant continue not to latch to be set up with DEBP.  Birth Parent will call RN/LC for latch assistance if needed, BF infant 8 to 12 times within 24 hrs, STS.  Maternal Data    Feeding Mother's Current Feeding Choice: Breast Milk  LATCH Score Latch: Grasps breast easily, tongue down, lips flanged, rhythmical sucking.  Audible Swallowing: Spontaneous and intermittent  Type of Nipple: Everted at rest and after stimulation  Comfort (Breast/Nipple): Soft / non-tender  Hold (Positioning): Assistance needed to correctly position infant at breast and maintain latch.  LATCH Score: 9   Lactation Tools Discussed/Used    Interventions Interventions: Assisted with latch;Skin to skin;Breast compression;Adjust position;Support pillows;Position options;Expressed milk;Education  Discharge    Consult Status Consult Status: Follow-up Date: 06/17/22 Follow-up type: In-patient    Vicente Serene 06/17/2022, 1:57 AM

## 2022-06-17 NOTE — Lactation Note (Signed)
This note was copied from a baby's chart. Lactation Consultation Note  Patient Name: Alicia Burch [id number] Date: 06/17/2022 Reason for consult: Follow-up assessment;Early term 37-38.6wks Age:41 hours   P4: Early term infant at 37+0 weeks Feeding preference: Breast Weight loss: 4%  Birth parent sleeping soundly; snoring very loudly when I arrived.  Spoke with support person regarding breast feeding.  Offered to begin supplementation and to begin pumping with the electric pump if desired.  Support person is interested in supplementing with donor milk and will speak to birth parent when she awakens.  Birth parent has to be the person to consent and sign the form.  Spoke with support person regarding the benefits of supplementation and pumping, however, made it clear that it is not mandatory at this time, especially if birth parent feels like "Darin" is latching and feeding well.  He has had multiple voids/stools.    While I was speaking with support person Darin became choked and held his breath.  Quickly showed support person how to remove swaddle and pat firmly on his back.  "Darin" cried after firm pats to the back and began to cry vigorously.  Swaddled and support person to hold him upright for awhile.  Updated RN and will have evening LC follow up if birth parent decides to begin supplementation and pumping.  Offered to return as needed for latch observation.      Maternal Data Has patient been taught Hand Expression?: Yes Does the patient have breastfeeding experience prior to this delivery?: Yes How long did the patient breastfeed?: Couple of months with her other children  Feeding Mother's Current Feeding Choice: Breast Milk  LATCH Score Latch: Grasps breast easily, tongue down, lips flanged, rhythmical sucking.  Audible Swallowing: Spontaneous and intermittent  Type of Nipple: Everted at rest and after stimulation  Comfort (Breast/Nipple): Soft / non-tender  Hold  (Positioning): No assistance needed to correctly position infant at breast.  LATCH Score: 10   Lactation Tools Discussed/Used    Interventions    Discharge    Consult Status Consult Status: Follow-up Date: 06/18/22 Follow-up type: In-patient    Loukas Antonson R Sonni Barse 06/17/2022, 3:22 PM

## 2022-06-18 MED ORDER — FERROUS SULFATE 325 (65 FE) MG PO TABS: 325.0000 mg | ORAL_TABLET | Freq: Two times a day (BID) | ORAL | 3 refills | Status: AC

## 2022-06-18 MED ORDER — DOCUSATE SODIUM 100 MG PO CAPS: 100.0000 mg | ORAL_CAPSULE | Freq: Two times a day (BID) | ORAL | 3 refills | Status: AC

## 2022-06-18 MED ORDER — OXYCODONE HCL 5 MG PO TABS: 5.0000 mg | ORAL_TABLET | Freq: Four times a day (QID) | ORAL | 0 refills | Status: AC | PRN

## 2022-06-18 MED ORDER — IBUPROFEN 600 MG PO TABS: 600.0000 mg | ORAL_TABLET | Freq: Four times a day (QID) | ORAL | 1 refills | Status: AC | PRN

## 2022-06-18 NOTE — Progress Notes (Signed)
Postpartum Note Day #2  S:  Patient doing well.  Reports some incisional pain relieved by Oxycodone. Reports some heaviness in her legs. Feels pressure in the abdomen with ambulation  Tolerating regular diet.   Ambulating and voiding without difficulty.  No lightheadedness/dizziness. Denies fevers, chills, chest pain, SOB, N/V, or worsening bilateral LE edema.  Lochia: Minimal Infant feeding:  Bottle Circumcision:  Desires prior to discharge Contraception:  Partner to have vasectomy  O: Temp:  [97.7 F (36.5 C)-98.2 F (36.8 C)] 98 F (36.7 C) (09/01 0528) Pulse Rate:  [75-83] 75 (09/01 0528) Resp:  [16-18] 16 (09/01 0528) BP: (106-130)/(66-77) 126/77 (09/01 0528) Gen: NAD, pleasant and cooperative Resp: No increased work of breathing Abdomen: soft, non-distended, non-tender throughout Uterus: firm, non-tender, below umbilicus Incision: c/d/i, honeycomb in place  Ext: Trace bilateral LE edema, no bilateral calf tenderness  Labs:     Latest Ref Rng & Units 06/17/2022    5:04 AM 06/15/2022    3:58 AM 02/26/2022    1:02 AM  CBC  WBC 4.0 - 10.5 K/uL 11.7  11.3  10.1   Hemoglobin 12.0 - 15.0 g/dL 7.6  9.8  10.9   Hematocrit 36.0 - 46.0 % 22.1  28.6  31.1   Platelets 150 - 400 K/uL 174  228  230      A/P: Patient is a 41 y.o. [id number] POD#2 s/p LTCS.  - Pain with moderate control -- will add abdominal binder today - GU: UOP is adequate - GI: Tolerating regular diet - Activity: encouraged sitting up to chair and ambulation as tolerated - DVT Prophylaxis: Ambulation, Lovenox  - Labs: as above, tolerating oral iron for acute blood loss anemia  Circumcision consent: Routine circumcisions performed on newborns have been identified as voluntary, elective procedures by [employer] such as the [employer].  It is considered an elective procedure with no definitive medical indication and carries risks.  Risks include but are not limited to bleeding,  infection, damage to penis with possible need for further surgery, poor cosmesis, and local anesthetic risks.  Circumcision will only be performed if patient is deemed to have normal anatomy by his Pediatrician, meets adequate criteria for a newborn of similar gestational age after birth and is without infection or other medical issue contraindicating an elective procedure.   Patient understands and agrees with above consent Patient discussed with parents of infant.  Disposition:  D/C home POD#3.   Drema Dallas, DO [phone] (office)

## 2022-06-19 NOTE — Lactation Note (Signed)
This note was copied from a baby's chart. Lactation Consultation Note  Patient Name: Alicia Burch GGYIR'S Date: 06/19/2022 Reason for consult: Follow-up assessment Age:40 hours  LC in to room, parents are expecting discharge. Infant is sleeping upon arrival. Birthing parent transitioned to formula feeding as discharge home is approaching. Birthing parent report pumping but collecting very little. Parents are pace bottle-feeding and have volume guidelines for formula feeding. Discussed normal behavior and patterns after 24h, voids and stools as signs good intake, pumping, clusterfeeding, skin to skin. Talked about milk coming into volume and managing engorgement.  Parents rented a DEBP until insurance pump is delivered.  Plan: 1-Feeding on demand or 8-12 times in 24h period. 2-Hand express/pump as needed for supplementation 3-Encouraged birthing parent rest, hydration and food intake.   Contact LC as needed for feeds/support/concerns/questions. All questions answered at this time. Reviewed [place] brochure.     Feeding Mother's Current Feeding Choice: Breast Milk and Formula  Lactation Tools Discussed/Used Tools: Pump Breast pump type: Manual;Double-Electric Breast Pump Reason for Pumping: LPTI Pumping frequency: 8-12 times in 24h  Interventions Interventions: Education;Pace feeding;LC Services brochure;Hand pump;DEBP;Expressed milk;LPT handout/interventions  Discharge Discharge Education: Warning signs for feeding baby;Engorgement and breast care Pump: Manual;DEBP;Rented WIC Program: Yes  Consult Status Consult Status: Complete Date: 06/19/22 Follow-up type: Call as needed    [place] 06/19/2022, 9:17 AM

## 2022-06-19 NOTE — Discharge Summary (Signed)
Postpartum Discharge Summary  Date of Service updated 06/19/2022     Patient Name: Alicia Burch DOB: 1981/03/29 MRN: [id number]  Date of admission: 06/15/2022 Delivery date:06/16/2022  Delivering provider: Everett Graff  Date of discharge: 06/19/2022  Admitting diagnosis: Cholestasis during pregnancy [O26.619, K83.1] Intrauterine pregnancy: [redacted]w[redacted]d    Secondary diagnosis:  Principal Problem:   Cholestasis during pregnancy  Additional problems: None    Discharge diagnosis: Term Pregnancy Delivered and cholestasis during pregnancy                                                Post partum procedures: None Augmentation: AROM, Pitocin, and Cytotec Complications: None  Hospital course: Onset of Labor With Unplanned C/S   41y.o. yo [id number] admitted in Latent Labor on 06/15/2022. Patient had a labor course significant for cytotec followed by pitocin / AROM . The patient went for cesarean section due to Arrest of Descent. Delivery details as follows: Membrane Rupture Time/Date: 5:30 PM ,06/15/2022   Delivery Method:C-Section, Low Transverse  Details of operation can be found in separate operative note. Patient had an uncomplicated postpartum course.  She is ambulating,tolerating a regular diet, passing flatus, and urinating well.  Patient is discharged home in stable condition 06/19/22.  Newborn Data: Birth date:06/16/2022  Birth time:5:48 AM  Gender:Female  Living status:Living  Apgars:8 ,9  Weight:3760 g   Magnesium Sulfate received: No BMZ received: No Rhophylac:N/A MMR:N/A T-DaP:Given prenatally Flu: N/A Transfusion:No  Physical exam  Vitals:   06/17/22 2006 06/18/22 0528 06/18/22 1925 06/19/22 0512  BP: 130/76 126/77 132/79 135/71  Pulse: 83 75 79 81  Resp: '17 16 18 17  ' Temp: 97.7 F (36.5 C) 98 F (36.7 C) 98 F (36.7 C) 98 F (36.7 C)  TempSrc: Oral Oral Oral Oral  SpO2:      Weight:      Height:       General: alert, cooperative, and no  distress Lochia: appropriate Uterine Fundus: firm Incision: Healing well with no significant drainage DVT Evaluation: No evidence of DVT seen on physical exam. Labs: Lab Results  Component Value Date   WBC 11.7 (H) 06/17/2022   HGB 7.6 (L) 06/17/2022   HCT 22.1 (L) 06/17/2022   MCV 83.4 06/17/2022   PLT 174 06/17/2022      Latest Ref Rng & Units 06/17/2022    5:04 AM  CMP  Glucose 70 - 99 mg/dL 100   BUN 6 - 20 mg/dL 9   Creatinine 0.44 - 1.00 mg/dL 0.76   Sodium 135 - 145 mmol/L 134   Potassium 3.5 - 5.1 mmol/L 3.7   Chloride 98 - 111 mmol/L 102   CO2 22 - 32 mmol/L 24   Calcium 8.9 - 10.3 mg/dL 8.3    Edinburgh Score:     No data to display            After visit meds:  Allergies as of 06/19/2022       Reactions   Avelox [moxifloxacin Hcl] Hives   Strattera [atomoxetine] Other (See Comments)   Migraines        Medication List     STOP taking these medications    omega-3 acid ethyl esters 1 g capsule Commonly known as: LOVAZA       TAKE these medications    docusate  sodium 100 MG capsule Commonly known as: Colace Take 1 capsule (100 mg total) by mouth 2 (two) times daily.   ferrous sulfate 325 (65 FE) MG tablet Take 1 tablet (325 mg total) by mouth 2 (two) times daily with a meal.   ibuprofen 600 MG tablet Commonly known as: ADVIL Take 1 tablet (600 mg total) by mouth every 6 (six) hours as needed for mild pain, moderate pain or cramping.   multivitamin-prenatal 27-0.8 MG Tabs tablet Take 1 tablet by mouth daily at 12 noon.   oxyCODONE 5 MG immediate release tablet Commonly known as: Oxy IR/ROXICODONE Take 1-2 tablets (5-10 mg total) by mouth every 6 (six) hours as needed for moderate pain, severe pain or breakthrough pain.   PROBIOTIC DAILY PO Take by mouth.         Discharge home in stable condition Infant Feeding: Breast Infant Disposition:home with mother Discharge instruction: per After Visit Summary and Postpartum  booklet. Activity: Advance as tolerated. Pelvic rest for 6 weeks.  Diet: routine diet Anticipated Birth Control: Unsure Postpartum Appointment:2 weeks Additional Postpartum F/U: Incision check 2 weeks  Future Appointments:No future appointments. Follow up Visit:  Follow-up Information     Drema Dallas, DO Follow up in 2 week(s).   Specialty: Obstetrics and Gynecology Why: Please keep your 2 week incision check and 6 week postpartum visit. Contact information: [address] [place] [address] [phone]                     06/19/2022 Christophe Louis, MD

## 2022-06-24 ENCOUNTER — Telehealth (HOSPITAL_COMMUNITY): Payer: Self-pay | Admitting: *Deleted

## 2022-06-24 NOTE — Telephone Encounter (Signed)
Mom reports feeling good. Incision healing well per mom. No concerns regarding herself at this time. EPDS declined. (hospital score=5) Mom reports baby is well. Feeding, peeing, and pooping without difficulty. Reviewed safe sleep. Mom has no concerns about baby at present.  Odis Hollingshead, RN 06-24-2022 at 12:30pm

## 2022-06-30 ENCOUNTER — Inpatient Hospital Stay (HOSPITAL_COMMUNITY): Payer: Medicaid Other
